# Patient Record
Sex: Female | Born: 1955 | Race: Black or African American | Hispanic: No | Marital: Single
Health system: Southern US, Academic
[De-identification: ages and names within clinical notes are randomized; demographics above are authoritative.]

## PROBLEM LIST (undated history)

## (undated) ENCOUNTER — Encounter: Payer: PRIVATE HEALTH INSURANCE | Attending: Family | Primary: Family

## (undated) ENCOUNTER — Encounter

## (undated) ENCOUNTER — Encounter: Attending: Family | Primary: Family

## (undated) ENCOUNTER — Encounter: Attending: Medical Oncology | Primary: Medical Oncology

## (undated) ENCOUNTER — Telehealth

## (undated) ENCOUNTER — Ambulatory Visit: Payer: PRIVATE HEALTH INSURANCE

## (undated) ENCOUNTER — Non-Acute Institutional Stay: Payer: PRIVATE HEALTH INSURANCE

## (undated) ENCOUNTER — Ambulatory Visit

## (undated) ENCOUNTER — Encounter
Attending: Student in an Organized Health Care Education/Training Program | Primary: Student in an Organized Health Care Education/Training Program

## (undated) ENCOUNTER — Telehealth: Attending: Medical Oncology | Primary: Medical Oncology

## (undated) ENCOUNTER — Encounter: Payer: PRIVATE HEALTH INSURANCE | Attending: Medical Oncology | Primary: Medical Oncology

## (undated) ENCOUNTER — Telehealth: Attending: Family | Primary: Family

## (undated) ENCOUNTER — Encounter: Payer: PRIVATE HEALTH INSURANCE | Attending: Women's Health | Primary: Women's Health

## (undated) ENCOUNTER — Encounter: Payer: PRIVATE HEALTH INSURANCE | Attending: Internal Medicine | Primary: Internal Medicine

## (undated) ENCOUNTER — Encounter: Payer: PRIVATE HEALTH INSURANCE | Attending: Registered" | Primary: Registered"

## (undated) ENCOUNTER — Encounter: Attending: Pharmacist | Primary: Pharmacist

## (undated) ENCOUNTER — Telehealth: Attending: Internal Medicine | Primary: Internal Medicine

---
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
## 2010-08-29 ENCOUNTER — Encounter
Admission: RE | Admit: 2010-08-29 | Discharge: 2010-08-29 | Payer: Self-pay | Source: Home / Self Care | Attending: Emergency Medicine | Admitting: Emergency Medicine

## 2011-12-30 IMAGING — MG MM DIGITAL SCREENING BILAT W/ CAD
4 series · 4 of 4 positions shown · non-contrast
Comparison: none

DG SCREEN MAMMOGRAM BILATERAL
Bilateral CC and MLO view(s) were taken.

DIGITAL SCREENING MAMMOGRAM WITH CAD:
The breast tissue is heterogeneously dense.  No masses or malignant type calcifications are 
identified.  Compared with prior studies.
Images were processed with CAD.

[R CC]
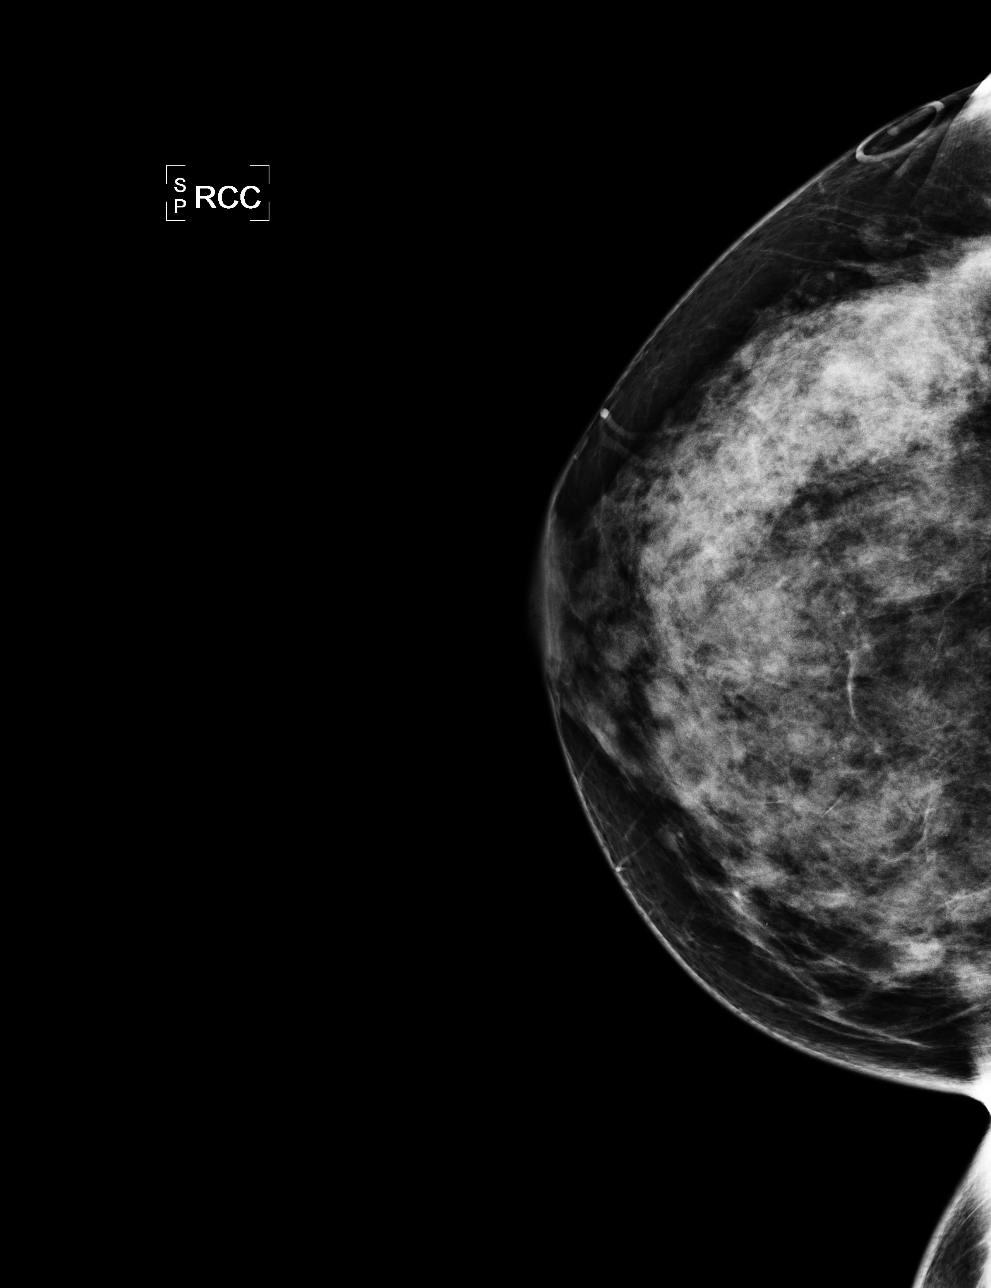

[L CC]
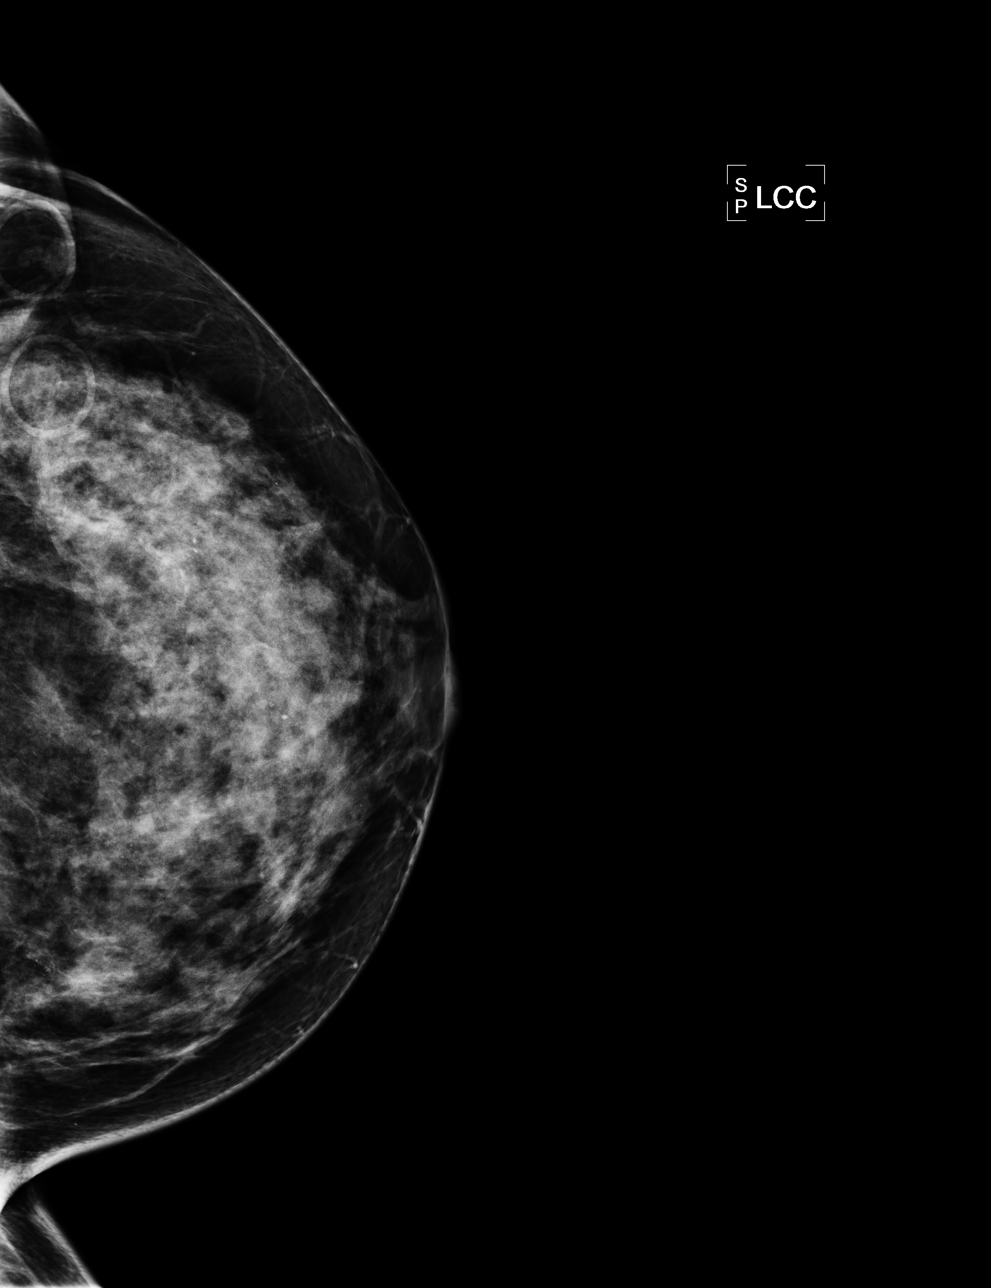

[L MLO]
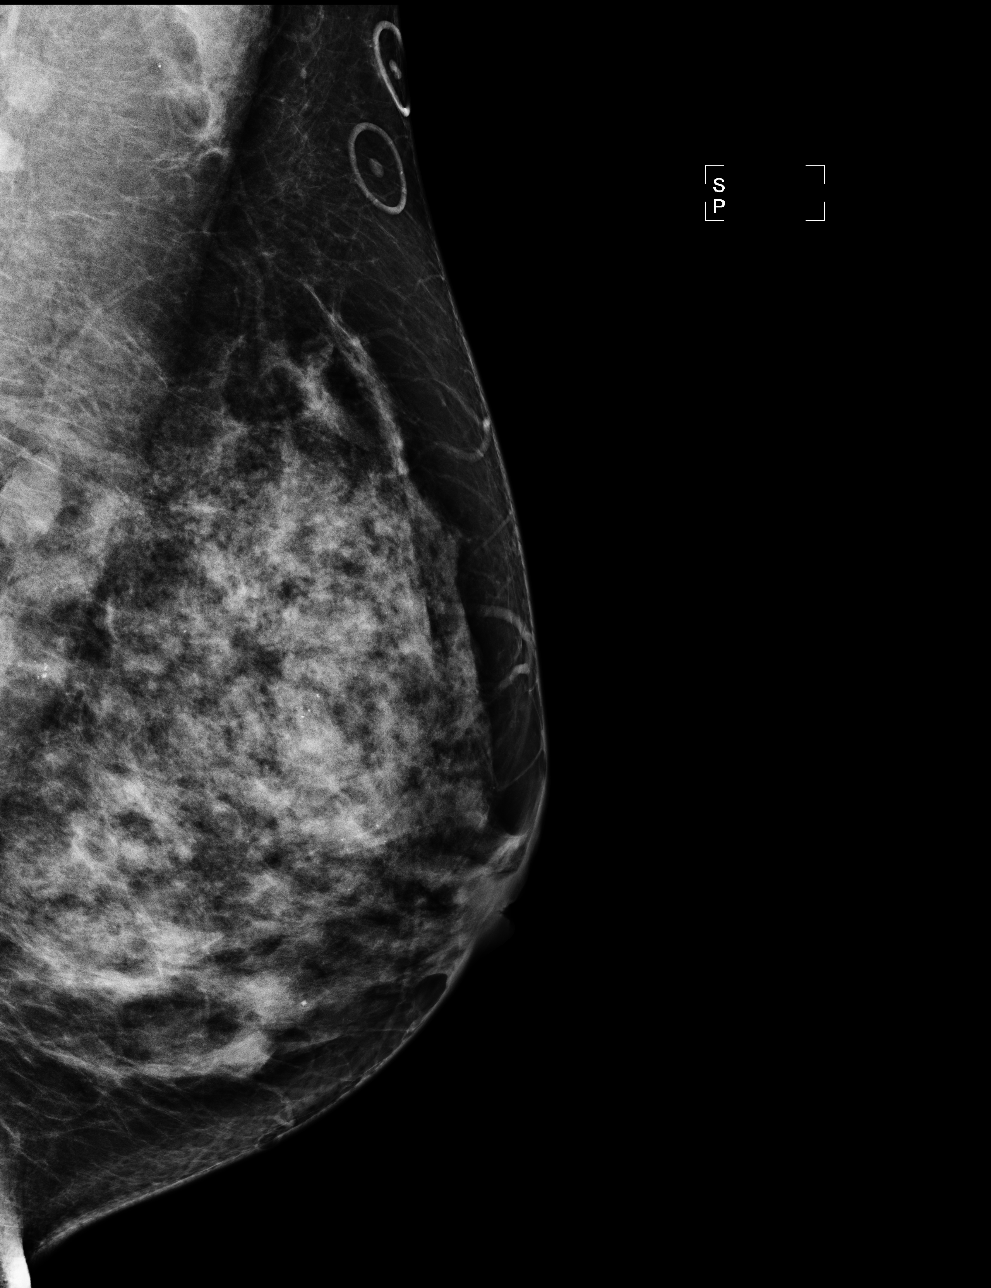

[R MLO]
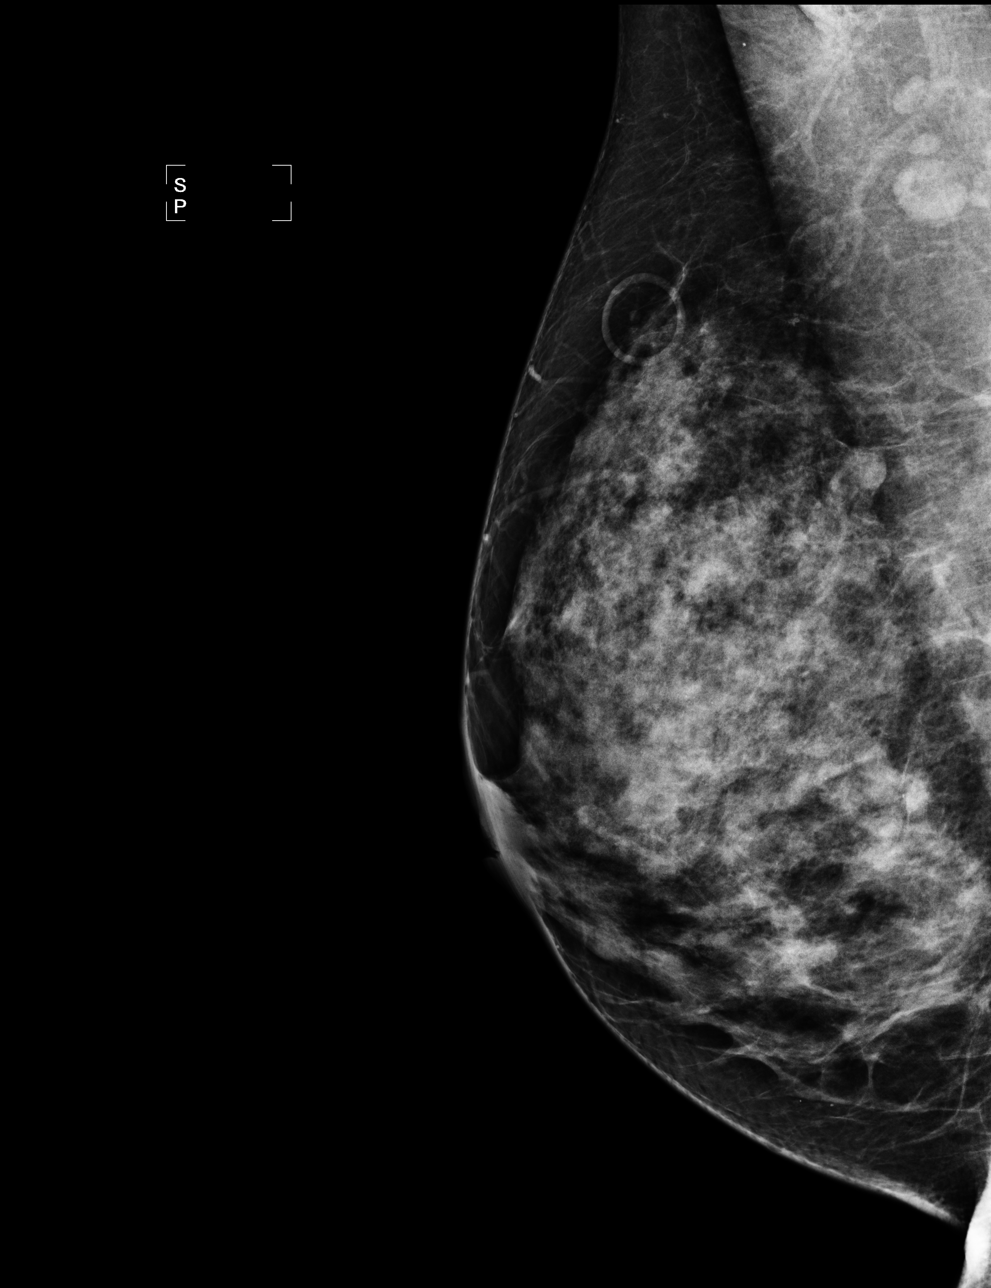

[4 of 4 positions shown; findings below may reference images not displayed]

IMPRESSION: No specific mammographic evidence of malignancy.  Next screening mammogram is recommended in one 
year.

A result letter of this screening mammogram will be mailed directly to the patient.

ASSESSMENT: Negative - BI-RADS 1

Screening mammogram in 1 year.
,

## 2016-05-05 ENCOUNTER — Encounter: Payer: Self-pay | Admitting: Emergency Medicine

## 2016-05-05 ENCOUNTER — Ambulatory Visit
Admission: EM | Admit: 2016-05-05 | Discharge: 2016-05-05 | Disposition: A | Discharge status: Home / Self Care | Payer: BLUE CROSS/BLUE SHIELD | Attending: Family Medicine | Admitting: Family Medicine

## 2016-05-05 ENCOUNTER — Ambulatory Visit (INDEPENDENT_AMBULATORY_CARE_PROVIDER_SITE_OTHER): Payer: BLUE CROSS/BLUE SHIELD

## 2016-05-05 DIAGNOSIS — S92354A Nondisplaced fracture of fifth metatarsal bone, right foot, initial encounter for closed fracture: Secondary | ICD-10-CM

## 2016-05-05 DIAGNOSIS — M79671 Pain in right foot: Secondary | ICD-10-CM | POA: Diagnosis not present

## 2016-05-05 MED ORDER — MELOXICAM 7.5 MG PO TABS: 7.5000 mg | ORAL_TABLET | Freq: Every day | ORAL | 0 refills | Status: AC

## 2016-05-05 NOTE — Discharge Instructions (Signed)
Take medication as prescribed. Rest. Ice and elevate.   Follow up with podiatry this week as discussed.  Follow up with your primary care physician this week as needed. Return to Urgent care for new or worsening concerns.

## 2016-05-05 NOTE — ED Provider Notes (Signed)
[facility] ____________________________________________  Time seen: Approximately 12:04 PM  I have reviewed the triage vital signs and the nursing notes.   HISTORY  Chief Complaint Foot Pain   HPI Mary Hancock is a 60 y.o. female presents for the complaint of right foot pain. Patient reports last Thursday she got up quickly to go to the restroom and states in that process she accidentally rolled right foot. Patient reports right foot pain since. Denies any other pain or injury. Denies head injury or loss of consciousness.  Patient reports pain at this time is mild. Patient reports yesterday the pain was more so present, and she had a call at work as she has to stand all day. Patient reports pain is primarily to right lateral foot but occasionally feels it on the bottom of her foot. Denies history of foot problems. Denies pain prior to injury. Denies pain radiation. Denies numbness or tingling sensation. Denies any other complaints. Patient reports over-the-counter Tylenol and Advil has helped.  No LMP recorded. Patient is postmenopausal.  ,  History reviewed. No pertinent past medical history.  There are no active problems to display for this patient.   History reviewed. No pertinent surgical history.    No current facility-administered medications for this encounter.   Current Outpatient Prescriptions:  .  meloxicam (MOBIC) 7.5 MG tablet, Take 1 tablet (7.5 mg total) by mouth daily., Disp: 10 tablet, Rfl: 0  Allergies Review of patient's allergies indicates no known allergies.   family history. Mother high blood pressure  Social History Social History  Substance Use Topics  . Smoking status: Never Smoker  . Smokeless tobacco: Never Used  . Alcohol use No    Review of Systems Constitutional: No fever/chills Eyes: No visual changes. ENT: No sore throat. Cardiovascular: Denies chest pain. Respiratory: Denies shortness of breath. Gastrointestinal:  No abdominal pain.  No nausea, no vomiting.  No diarrhea.  No constipation. Genitourinary: Negative for dysuria. Musculoskeletal: Negative for back pain.As above. Skin: Negative for rash. Neurological: Negative for headaches, focal weakness or numbness.  10-point ROS otherwise negative.  ____________________________________________   PHYSICAL EXAM:  VITAL SIGNS: ED Triage Vitals  Enc Vitals Group     BP 05/05/16 1152 (!) 141/79     Pulse Rate 05/05/16 1152 66     Resp 05/05/16 1152 16     Temp 05/05/16 1152 97.8 F (36.6 C)     Temp Source 05/05/16 1152 Tympanic     SpO2 05/05/16 1152 100 %     Weight 05/05/16 1153 150 lb (68 kg)     Height 05/05/16 1153 5\' 1"  (1.549 m)     Head Circumference --      Peak Flow --      Pain Score 05/05/16 1155 1     Pain Loc --      Pain Edu? --      Excl. in GC? --     Constitutional: Alert and oriented. Well appearing and in no acute distress. Eyes: Conjunctivae are normal. PERRL. EOMI. ENT      Head: Normocephalic and atraumatic. Cardiovascular: Normal rate, regular rhythm. Grossly normal heart sounds.  Good peripheral circulation. Respiratory: Normal respiratory effort without tachypnea nor retractions. Breath sounds are clear and equal bilaterally. No wheezes/rales/rhonchi.. Musculoskeletal:  Changes positions quickly with movement in all extremities.  Bilateral pedal pulses equal and easily palpated. Except: Right lateral mid to proximal foot mild tenderness to palpation, minimal swelling, no ecchymosis, no erythema, skin intact, right foot full  range of motion but mild pain with ankle rotation. No pain with plantar flexion or dorsiflexion of right foot. Right foot with normal sensation and normal distal capillary refill. Neurologic:  Normal speech and language. No gross focal neurologic deficits are appreciated. Speech is normal. No gait instability.  Skin:  Skin is warm, dry and intact. No rash noted. Psychiatric: Mood and affect are  normal. Speech and behavior are normal. Patient exhibits appropriate insight and judgment   ___________________________________________   LABS (all labs ordered are listed, but only abnormal results are displayed)  Labs Reviewed - No data to display ____________________________________________  RADIOLOGY  Dg Foot Complete Right  Result Date: 05/05/2016 CLINICAL DATA:  Rolled right foot 5 days ago, fourth and fifth metatarsal pain EXAM: RIGHT FOOT COMPLETE - 3+ VIEW COMPARISON:  None. FINDINGS: Three views of the right foot submitted. There is nondisplaced transverse fracture at the base of fifth metatarsal. No radiopaque foreign body. IMPRESSION: Nondisplaced fracture at the base of fifth metatarsal. Electronically Signed   By: Natasha MeadLiviu  Pop M.D.   On: 05/05/2016 12:28   ____________________________________________   PROCEDURES Procedures   Right foot boot applied by RN.   INITIAL IMPRESSION / ASSESSMENT AND PLAN / ED COURSE  Pertinent labs & imaging results that were available during my care of the patient were reviewed by me and considered in my medical decision making (see chart for details).  Well-appearing patient. No acute distress. Presents for the complaints of right lateral foot pain post mechanical injury this past Thursday. Denies other pain or injury. Patient reports pain present immediately after rolling her foot. Will evaluate x-ray.  X-ray reviewed. Per radiologist's nondisplaced fracture the base of the fifth metatarsal. However in reviewing the x-ray myself, concern for possible lateral calcaneal avulsion fracture as well. Discussed this in detail with patient, and reviewed xray. Discussed options of treatment and follow-up. Patient reports she has been ambulating on right foot since the injury. As this is multiple days old and patient has remained ambulatory, will place patient in a boot. Patient states that she does not want to be on crutches, but states has crutches at  home and will use. Encouraged ice, elevation and limited weightbearing. Follow-up with podiatry in 1-2 days. Information for podiatry given. Mobic daily as needed for pain.Discussed indication, risks and benefits of medications with patient.  Discussed follow up with Primary care physician this week. Discussed follow up and return parameters including no resolution or any worsening concerns. Patient verbalized understanding and agreed to plan.   ____________________________________________   FINAL CLINICAL IMPRESSION(S) / ED DIAGNOSES  Final diagnoses:  Nondisplaced fracture of fifth metatarsal bone, right foot, initial encounter for closed fracture  Right foot pain     Discharge Medication List as of 05/05/2016 12:57 PM    START taking these medications   Details  meloxicam (MOBIC) 7.5 MG tablet Take 1 tablet (7.5 mg total) by mouth daily., Starting Tue 05/05/2016, Normal        Note: This dictation was prepared with Dragon dictation along with smaller phrase technology. Any transcriptional errors that result from this process are unintentional.    Clinical Course      Renford DillsLindsey Marra Fraga, NP 05/05/16 1330

## 2016-05-05 NOTE — ED Triage Notes (Signed)
Patient states that she rolled her right foot last Thursday and is having pain in her right foot.

## 2017-06-21 ENCOUNTER — Ambulatory Visit: Admission: RE | Admit: 2017-06-21 | Discharge: 2017-07-21 | Disposition: A | Discharge status: Home / Self Care

## 2017-06-21 ENCOUNTER — Ambulatory Visit
Admission: RE | Admit: 2017-06-21 | Discharge: 2017-07-21 | Disposition: A | Discharge status: Home / Self Care | Payer: BC Managed Care – PPO

## 2017-06-21 DIAGNOSIS — C50811 Malignant neoplasm of overlapping sites of right female breast: Principal | ICD-10-CM

## 2017-07-12 ENCOUNTER — Ambulatory Visit
Admit: 2017-07-12 | Discharge: 2017-07-12 | Disposition: A | Discharge status: Home / Self Care | Payer: BC Managed Care – PPO

## 2017-07-12 ENCOUNTER — Ambulatory Visit
Admission: RE | Admit: 2017-07-12 | Discharge: 2017-07-12 | Payer: BC Managed Care – PPO | Attending: Surgical Oncology | Admitting: Surgical Oncology

## 2017-07-12 DIAGNOSIS — C50911 Malignant neoplasm of unspecified site of right female breast: Principal | ICD-10-CM

## 2017-07-15 ENCOUNTER — Ambulatory Visit
Admission: RE | Admit: 2017-07-15 | Discharge: 2017-07-15 | Disposition: A | Discharge status: Home / Self Care | Payer: BC Managed Care – PPO

## 2017-07-15 ENCOUNTER — Ambulatory Visit
Admission: RE | Admit: 2017-07-15 | Discharge: 2017-07-15 | Disposition: A | Discharge status: Home / Self Care | Payer: BC Managed Care – PPO | Attending: Surgical Oncology | Admitting: Surgical Oncology

## 2017-07-15 DIAGNOSIS — C50811 Malignant neoplasm of overlapping sites of right female breast: Secondary | ICD-10-CM

## 2017-07-15 DIAGNOSIS — C50911 Malignant neoplasm of unspecified site of right female breast: Principal | ICD-10-CM

## 2017-07-15 DIAGNOSIS — Z170 Estrogen receptor positive status [ER+]: Secondary | ICD-10-CM

## 2017-07-16 ENCOUNTER — Ambulatory Visit
Admission: RE | Admit: 2017-07-16 | Discharge: 2017-07-16 | Disposition: A | Discharge status: Home / Self Care | Payer: BC Managed Care – PPO | Attending: Medical Oncology | Admitting: Medical Oncology

## 2017-07-16 DIAGNOSIS — C50911 Malignant neoplasm of unspecified site of right female breast: Principal | ICD-10-CM

## 2017-07-16 NOTE — Unmapped (Signed)
MEDICAL ONCOLOGY NEW PATIENT VISIT    Patient Name: Brenda Mccarty  Patient Age: 61 y.o.  Encounter Date: 07/16/2017  Referring Physician:   Referred Self  No address on file  Primary Care Provider:  No PCP Per Patient    Cancer Team  Surgical Oncology: Lucretia Roers, MD  Radiation Oncology: Thom Chimes, MD  Medical Oncology: Marc Morgans, MD  Plastic Surgery: None at this time  Genetics:     History of Present Illness:   Brenda Mccarty is a 61 y.o. female who is seen in consultation at the request of Dr Tama Gander for an evaluation of Breast Cancer.    See details below.  Young woman with strong family hx and unknown mutational status.  Presents with 2-3 month h/o breast swelling. Has been seen at [facility] but [name] is now out of network for her. Has a large fixed right breast mass and + axillary nodes clinically palpable.  Bx  At [facility] has shown  IDCA grade 3 ER 94% PR <1% abd HER 2 1+    ROS:  Wtloss, LBP and hip pain x few weeks.  Decreased appetite, mild fatigue  LMP 2009        Review of Systems: A complete review of systems was obtained including: Constitutional, Eyes, ENT, Cardiovascular, Respiratory, GI, GU, Musculoskeletal, Skin, Neurological, Psychiatric, Endocrine, Heme/Lymphatic, and Allergic/Immunologic systems. It is negative or non-contributory to the patient???s management except for the above  Medical History:  Oncology History    61 postmenopausal yF         Malignant neoplasm of overlapping sites of right female breast (CMS-HCC)    05/2017 -  Presenting Symptoms     End of Sept pt noticed b/l breast swelling, 1 week later left breast swelling resolved but right breast swelling worsened.   Last that week noticed non-erythematous-painful skin dimpling to the lateral right breast. She also palpated an intermittent mass in the right breast with baseline inverted nipples that seemed more pulled in on the right. Two weeks later presented to Urgent Care, prompting MMG/US.            06/24/2017 Interval Scan(s) B/L Dx MMG: increased right breast density and ill-defined mass @9 :00 with associated posterior architectural distortion. Benign calcs B/L.   Left breast clear  [place]: Right breast 2.5 cm irregular mass at 9:00 middle depth.          06/29/2017 Biopsy     Right breast Bx: 9:00 7CFN, IDC-G2 with DCIS-G3, LVI  Right axillary LN Bx: IDC, G3         06/30/2017 -  Other     New pt consult-[facility], MD.   PE showed Rt enlarged-retracted-elevated breast, large irregularly shaped mobile mass along entire lateral aspect of breast from 7-1:00, nipple inversion Rt>Lt, skin dimpling inferior nipple, +Rt axillary LN.  RECS: B/L MRI breast and genetics referral.           Other             Past Medical History:   Diagnosis Date   ??? Hypertension       Past Surgical History:   Procedure Laterality Date   ??? MYOMECTOMY        Family History   Problem Relation Age of Onset   ??? Heart attack Mother    ??? Pancreatic cancer Father    ??? Breast cancer Sister         Deceased of BCA at 57y/o   ??? Sarcoidosis  Brother    ??? Breast cancer Sister         Alive, Dx BCA at 29 y/o-remission doing well   ??? Kidney cancer Sister    ??? Breast cancer Other         Niece Dx 42 y/o-remission doing well        SOCIAL  HX:  Here today with 2 family members.  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 No tobacco or ETOH.  Married . No children  No Known Allergies    Current Outpatient Prescriptions:   ???  acetaminophen (TYLENOL) 500 MG tablet, Take 500 mg by mouth., Disp: , Rfl:   ???  ibuprofen (ADVIL,MOTRIN) 200 MG tablet, Take 200 mg by mouth., Disp: , Rfl:   ???  lisinopril (PRINIVIL,ZESTRIL) 10 MG tablet, Take by mouth., Disp: , Rfl:   ???  meloxicam (MOBIC) 7.5 MG tablet, Take 7.5 mg by mouth., Disp: , Rfl:     Objective History:  BP 119/61  - Pulse 78  - Temp 36.6 ??C (97.9 ??F) (Oral)  - Resp 16  - Ht 158.1 cm (5' 2.24)  - Wt 66.4 kg (146 lb 6.4 oz)  - SpO2 99%  - BMI 26.57 kg/m??   BMI: Body mass index is 26.57 kg/m??.  GENERAL: Well appearing female in no acute distress.  SKIN: no rashes or skin changes noted  LUNGS: CTA b/l  CARDIAC: RRR , no murmur  CHEST/BREASTS: large fixed right breast mass at least 10x 11. + palpable right ax node  LYMPH: no OTHER cervical, axillary or supraclavicular lymphadenopathy  ABDOMEN: Soft, nontender, no hepatomegaly  EXTREMITIES: no edema, normal skin tone  MSK: No focal areas of bone tenderness  NEURO: Alert, oriented, gait/coordination normal    Orders/Results:  I have reviewed the laboratory, pathology, and radiology reports in detail and discussed findings with patient and spouse    Assessment/Plan:  1. Invasive ductal carcinoma of breast, female, right (CMS-HCC)  ER+ PR- HER 2 =1+    After reviewing all available records and imaging, we had a frank and thorough discussion regarding her course of care. The risks and benefits of this treatment plan were discussed in detail.  ??  Recommendations and Plan:I spent at least 60 minutes with this patient more than 50% in counseling. The following issues were discussed:      Today, I have talked with the patient about the   nature and diagnosis of early stage breast cancer.  We talked about  the role of chemotherapy in reducing the risk of systemic recurrence.  We have talked about the  Receptors on her cancer. We have talked about a regimen including cytotoxic  chemotherapy, antiestrogen therapy, surgery, radiation,  and then we have talked about the ordering of therapy.  One option  would be a primary surgical approach and then adjuvant therapy  .  Another option would be the neoadjuvant  approach; if we used that approach, I would recommend AC dose-dense  x4 followed by 12 weeks' Taxol  followed by surgery,  followed by radiation  and then endocrine  therapy to follow for 5 years at least.   We reviewed the data on neoadjuvant therapy. There is no survival difference related to order of therapy in the large randomized trials. There is a higher rate of breast conserving therapy in the neoadjuvatn groups. One advantage of the neoadjuvant  approach is that it allow [place] to get a personalized assessment of  whether her  disease responds to therapy.  Furthermore , we and other groups have shown that  post neoadjuvant chemo staging predicts for  outcome .  So, we have discussed  the pros and cons of each approach and she has elected to go forward  with neoadjuvant therapy.    We have then turned our attention to the logistics of the regimen.  We have talked   about Adriamycin and Cytoxan x4.  We have talked about hair loss,  myelosuppression, nausea and vomiting, rare risk of cardiac toxicity.      We next turned our attention to required elements before we could  get started.  We are going to do staging CT.  We are going to do  a MUGA or echo.  She will need a Port-A-Cath.    She has  my card and e-mail and knows to call with any problems or questions.  All of her questions were answered today to the best of my ability  to her apparent satisfaction.      Plan:  Staging   ECHO  Port  ddAC-T if staging negative  Needs genetics work up if not already done at [facility]

## 2017-07-16 NOTE — Unmapped (Signed)
It was a pleasure seeing you today.   Continue with the current plan of care.  Follow-up in 1 week.        Labs from today if done:       Future Appointments:    For appointments call [phone]  For new symptoms or health related issues, please call   Nurse Navigator: Dan Maker, RN [phone]  On weekends and after hours on weekdays, please call [phone] for the oncology fellow on call.      If you use my[facility]chart, please know that I check messages only twice a week. If your message is urgent, please call or page your nurse navigator or the on-call fellow.

## 2017-07-21 ENCOUNTER — Ambulatory Visit
Admission: RE | Admit: 2017-07-21 | Discharge: 2017-07-21 | Disposition: A | Discharge status: Home / Self Care | Payer: BC Managed Care – PPO

## 2017-07-21 DIAGNOSIS — C50911 Malignant neoplasm of unspecified site of right female breast: Principal | ICD-10-CM

## 2017-07-22 ENCOUNTER — Ambulatory Visit
Admission: RE | Admit: 2017-07-22 | Discharge: 2017-07-22 | Disposition: A | Discharge status: Home / Self Care | Payer: BC Managed Care – PPO

## 2017-07-22 DIAGNOSIS — C50911 Malignant neoplasm of unspecified site of right female breast: Principal | ICD-10-CM

## 2017-07-23 ENCOUNTER — Ambulatory Visit: Admission: RE | Admit: 2017-07-23 | Discharge: 2017-07-23 | Disposition: A | Discharge status: Home / Self Care

## 2017-07-23 ENCOUNTER — Ambulatory Visit
Admission: RE | Admit: 2017-07-23 | Discharge: 2017-07-23 | Disposition: A | Discharge status: Home / Self Care | Payer: BC Managed Care – PPO | Attending: Medical Oncology | Admitting: Medical Oncology

## 2017-07-23 DIAGNOSIS — C50919 Malignant neoplasm of unspecified site of unspecified female breast: Principal | ICD-10-CM

## 2017-07-23 DIAGNOSIS — C50911 Malignant neoplasm of unspecified site of right female breast: Principal | ICD-10-CM

## 2017-07-23 LAB — COMPREHENSIVE METABOLIC PANEL
ALBUMIN: 3.9 g/dL (ref 3.5–5.0)
ALKALINE PHOSPHATASE: 80 U/L (ref 38–126)
ALT (SGPT): 33 U/L (ref 15–48)
ANION GAP: 8 mmol/L — ABNORMAL LOW (ref 9–15)
AST (SGOT): 40 U/L — ABNORMAL HIGH (ref 14–38)
BILIRUBIN TOTAL: 0.5 mg/dL (ref 0.0–1.2)
BLOOD UREA NITROGEN: 20 mg/dL (ref 7–21)
CALCIUM: 10.2 mg/dL (ref 8.5–10.2)
CHLORIDE: 100 mmol/L (ref 98–107)
CO2: 30 mmol/L (ref 22.0–30.0)
CREATININE: 0.74 mg/dL (ref 0.60–1.00)
EGFR MDRD AF AMER: >=60 mL/min/{1.73_m2} (ref >=60)
EGFR MDRD NON AF AMER: >=60 mL/min/{1.73_m2} (ref >=60)
POTASSIUM: 4.5 mmol/L (ref 3.5–5.0)
PROTEIN TOTAL: 7.5 g/dL (ref 6.5–8.3)
SODIUM: 138 mmol/L (ref 135–145)

## 2017-07-23 LAB — CBC W/ AUTO DIFF
BASOPHILS ABSOLUTE COUNT: 0 10*9/L (ref 0.0–0.1)
EOSINOPHILS ABSOLUTE COUNT: 0.1 10*9/L (ref 0.0–0.4)
HEMATOCRIT: 32.5 % — ABNORMAL LOW (ref 36.0–46.0)
HEMOGLOBIN: 10.2 g/dL — ABNORMAL LOW (ref 12.0–16.0)
LARGE UNSTAINED CELLS: 3 % (ref 0–4)
LYMPHOCYTES ABSOLUTE COUNT: 1.5 10*9/L (ref 1.5–5.0)
MEAN CORPUSCULAR HEMOGLOBIN CONC: 31.4 g/dL (ref 31.0–37.0)
MEAN CORPUSCULAR HEMOGLOBIN: 29.2 pg (ref 26.0–34.0)
MEAN PLATELET VOLUME: 10.1 fL — ABNORMAL HIGH (ref 7.0–10.0)
MONOCYTES ABSOLUTE COUNT: 0.2 10*9/L (ref 0.2–0.8)
NEUTROPHILS ABSOLUTE COUNT: 2.5 10*9/L (ref 2.0–7.5)
PLATELET COUNT: 286 10*9/L (ref 150–440)
RED BLOOD CELL COUNT: 3.49 10*12/L — ABNORMAL LOW (ref 4.00–5.20)
WBC ADJUSTED: 4.4 10*9/L — ABNORMAL LOW (ref 4.5–11.0)

## 2017-07-23 LAB — PLATELET COUNT: Lab: 286

## 2017-07-23 LAB — CALCIUM: Calcium:MCnc:Pt:Ser/Plas:Qn:: 10.2

## 2017-07-23 MED ORDER — PALBOCICLIB 125 MG CAPSULE: 125 mg | capsule | Freq: Every day | 0 refills | 0 days | Status: AC

## 2017-07-23 MED ORDER — TRAMADOL 50 MG TABLET
ORAL_TABLET | Freq: Four times a day (QID) | ORAL | 0 refills | 0.00000 days | Status: CP | PRN
Start: 2017-07-23 — End: 2017-11-19

## 2017-07-23 MED ORDER — LETROZOLE 2.5 MG TABLET
ORAL_TABLET | Freq: Every day | ORAL | 11 refills | 0 days | Status: CP
Start: 2017-07-23 — End: 2017-11-19

## 2017-07-23 MED ORDER — PALBOCICLIB 125 MG CAPSULE
ORAL_CAPSULE | Freq: Every day | ORAL | 0 refills | 0.00000 days | Status: CP
Start: 2017-07-23 — End: 2017-07-23

## 2017-07-23 NOTE — Unmapped (Signed)
If you feel like this is an emergency please call 911.  For appointments or questions Monday through Friday 8AM-5PM please call [phone] or Toll Free [phone]. For Medical questions or concerns ask for the Nurse Triage Line.  On Nights, Weekends, and Holidays call [phone] and ask for the Oncologist on Call.  Reasons to call the Nurse Triage Line:  Fever of 100.5 or greater  Nausea and/or vomiting not relieved with nausea medicine  Diarrhea or constipation  Severe pain not relieved with usual pain regimen  Shortness of breath  Uncontrolled bleeding  Mental status changes

## 2017-07-23 NOTE — Unmapped (Signed)
Pt received to chair 55, Pt has a port already in place with a good blood return. This is the patient's first time receiving zometa. Pharmacist was paged.  1345 pt tolerated treatment. Port was flushed and deaccessed. Pt given a copy of AVS.  Pt was discharged from clinic.

## 2017-07-23 NOTE — Unmapped (Signed)
Pharmacy: First Dose Patient Education    Medication: Zometa  Venous Access: port    Ms. Orosz is a 61 year old with metastatic breast cancer. Education was provided to the patient by an oncology pharmacist.      Side effects discussed included but were not limited to:   infusion-related reactions, nausea/vomiting, flu-like symptoms, myalgia or fatigue.    The [facility] patient handout or the Hematology/Oncology Fellow on-call phone number for concerns after hours were given.The patient verbalized understanding of this information.  Medication reconciliation was completed and the medication list was updated in EPIC.      Approximate time spent with patient: 10  Minutes.    Kennon Holter, PharmD, CPP

## 2017-07-23 NOTE — Unmapped (Signed)
Labs within parameters for treatment. Drug requested from pharmacy.

## 2017-07-27 ENCOUNTER — Ambulatory Visit
Admission: RE | Admit: 2017-07-27 | Discharge: 2017-07-27 | Disposition: A | Discharge status: Home / Self Care | Payer: BC Managed Care – PPO

## 2017-07-27 DIAGNOSIS — C50919 Malignant neoplasm of unspecified site of unspecified female breast: Principal | ICD-10-CM

## 2017-07-27 NOTE — Unmapped (Signed)
VIR POST PROCEDURE NOTE      Date/Time: 07/27/2017/12:35 PM      Attending: Dr. Lissa Hoard      Assistant(s): Dr. Waynard Edwards      Diagnosis: History of breast cancer with liver lesions      Procedure:  Biopsy      Time out: Prior to the procedure, a time out was performed with all team members present.  During the time out, the patient, procedure and procedure site when applicable were verbally verified by the team members and Dr. Lissa Hoard.      Anesthesia: Conscious Sedation     Medications Given:  3 mg Versed IV, 75 mcg Fentanyl IV     Contrast Used: None     Complications: NONE     Estimated Blood Loss: Minimal    Specimens:  3 18-gauge core biopsy samples sent for tissue diagnosis       Major Findings:  Multiple hypoechoic liver lesion. Successful ultrasound guided biopsy of a segment 4b lesion.       Plan: Follow up pathology results.       Attending Signature:  Lissa Hoard MD, PhD    See detailed procedure note with images in PACS.      The patient tolerated the procedure well without incident or complication and was returned to PRU.

## 2017-07-27 NOTE — Unmapped (Signed)
Assessment/Plan:    Brenda Mccarty is a 61 y.o. female who will undergo targeted liver biopsy in Interventional Radiology.    --This procedure has been fully reviewed with the patient/patient???s authorized representative. The risks, benefits and alternatives have been explained, and the patient/patient???s authorized representative has consented to the procedure.  --The patient will accept blood products in an emergent situation.  --The patient does not have a Do Not Resuscitate order in effect.    HPI: Brenda Mccarty is a 61 y.o. female with history of breast cancer and new liver lesions who is scheduled to undergo target liver biopsy.     Allergies: No Known Allergies    Medications:  No relevant medications, please see full medication list in Epic.    ASA Grade: ASA 2 - Patient with mild systemic disease with no functional limitations    PSH:   Past Surgical History:   Procedure Laterality Date   ??? IR INSERT PORT AGE GREATER THAN 5 YRS  07/22/2017    IR INSERT PORT AGE GREATER THAN 5 YRS 07/22/2017 Brenda Erie, MD IMG VIR [facility]   ??? MYOMECTOMY         PMH:   Past Medical History:   Diagnosis Date   ??? Hypertension        PE:    Vitals:    07/27/17 1017   BP: 103/58   Pulse: 60   Temp: 36.4 ??C (97.5 ??F)   SpO2: 100%     General: WD, WN female in NAD.   HEENT: Normocephalic, atraumatic.   Lungs: Respirations nonlabored  Mallampati Class:  Class II        Brenda James, MD  07/27/2017, 10:20 AM

## 2017-07-29 MED FILL — IBRANCE/125MG/CAPS: IBRANCE/125MG/CAPS | 28 days supply | Qty: 21 | Fill #0

## 2017-07-29 NOTE — Unmapped (Signed)
Clinical Pharmacist Practitioner    [employer] [place] Education    Brenda Mccarty is a 61 y.o. female with metastatic breast cancer who I am counseling today on initiation of oral chemotherapy.    Oral chemotherapy regimen: palbociclib 125 mg daily and letrozole  Tentative Start Date: 07/30/17    Side effects discussed included but were not limited to: fatigue, neutropenia, thrombocytopenia, nausea, diarrhea, decreased appetite.   Side effect prevention and management were also reviewed. Blood count monitoring will be needed every 2 weeks for the first 2 months then monthly prior to each cycle there after. Proper administration of the medication including the importance of taking with food, oral chemotherapy handling precautions, as well as the importance of medication adherence were explained.    Instructed the patient that this medication has potential drug interactions.  The patient should inform me of any new prescriptions so that I may evaluate for potential drug interactions.    Patient verbalized understanding of the above information as well as how to contact the Team with any questions/concerns.    Approximate time with patient: 10 min    Laverna Peace PharmD, BCOP, CPP  Hematology/Oncology Pharmacist  P: [phone]    Next Scheduled Delivery Date: 07/30/17 from [facility]   Confirmed Shipping address: [address] [place] [address]    Financial/Shipment   Primary Billing: BCBS  Secondary Billing [place] Bucker, [place], CoPay Card, Secondary Commercial): copay card   Anticipated copay of $0 reviewed with patient (See full details under Referrals tab in EPIC).    Verified delivery address in FSI and reviewed medication storage requirement.    The following was explained to the patient:  Advised patient of the following:  -A Welcome packet will be sent to the patient   -Assignment of Benefit for the patient to review and return before next refill  -Arrangement of payment method can be done by contacting the pharmacy  -Take medications with during travel, have doctor's appointments, or if being admitted to the hospital.    Advised patient of refill order process:  Specialty pharmacy process for medication shipment was also reviewed.  Discussed the service provided by [facility] with respects to monthly outbound calls to the patient to set up refill deliveries 7-10 days prior to their subsequent needed refill.  Emphasized need for patient to be reachable in order to schedule medication shipment and that shipment will be shipped to the deliverable address provided via UPS.  Informed patient that welcome packet will be sent.        Provided the [facility] pharmacy contact information below:  [facility] [phone], option 4)    Patient Specific   The following items were reviewed and no issues were identified:  Complete medication list, PMH, allergies    Relevant cultural assessment and health literacy was completed as below:  Patient speaks  English, has no other identified physical or cognitive barriers and is able to store his/her medication as directed.  Patient prefers to have medications discussed with  Patient.  Patient is able to read and understand education materials at a high school level or above.

## 2017-08-02 NOTE — Unmapped (Signed)
STRATA ordered per Dr. Leana Roe.   ??  Histology will cut slides from 07/27/2017 biopsy for [facility]. Once report is generated it will be put in pts Labs tab in their EMR.

## 2017-08-02 NOTE — Unmapped (Signed)
Interval Visit Note    Patient Name: Brenda Mccarty  Patient Age: 61 y.o.  Encounter Date: 07/23/2017    PCP:No PCP Per Patient  REFERRING MD:  Dr Lucretia Roers        HPI/ INTERVAL:  The patient is a 61 year old woman whom I first met 1 week ago. At that time she had presented with a large clinical T3 pathologic N1 right breast cancer which was ER positive PR negative HER-2 negative. Please see last weeks notes for full details and the oncology history below.    She returns today to discuss her staging studies which unfortunately show metastatic disease. The bone scan is diffusely positive and she has apparent pulmonary and liver metastases.    She is unaccompanied today however after we've talked for a bit it becomes clear that her husband is in the waiting room side asked the nurses to bring him in the room so he could hear this news as well.    On review of symptoms she continues to have decreased appetite mild nausea and mild insomnia lower back pain and pain in her right breast.    ROS: She has filled out the [facility] [id number] form which I have reviewed with her and filed in med rec . Comprehensive 12 point ROS is performed and is negative except as noted above.           ONCOLOGY HX:    Oncology History    61 postmenopausal yF  With presenting stage 4 breast cancer 07/2017  ER+ PR- HER2-         Malignant neoplasm of overlapping sites of right female breast (CMS-HCC)    05/2017 -  Presenting Symptoms     End of Sept pt noticed b/l breast swelling, 1 week later left breast swelling resolved but right breast swelling worsened.   Last that week noticed non-erythematous-painful skin dimpling to the lateral right breast. She also palpated an intermittent mass in the right breast with baseline inverted nipples that seemed more pulled in on the right. Two weeks later presented to Urgent Care, prompting MMG/US.            06/24/2017 Interval Scan(s)     B/L Dx MMG: increased right breast density and ill-defined mass @9 :00 with associated posterior architectural distortion. Benign calcs B/L.   Left breast clear  [place]: Right breast 2.5 cm irregular mass at 9:00 middle depth.          06/29/2017 Biopsy     Right breast Bx: 9:00 7CFN, IDC-G2 with DCIS-G3, LVI  Right axillary LN Bx: IDC, G3         06/30/2017 -  Other     New pt consult-[facility] [place], MD.   PE showed Rt enlarged-retracted-elevated breast, large irregularly shaped mobile mass along entire lateral aspect of breast from 7-1:00, nipple inversion Rt>Lt, skin dimpling inferior nipple, +Rt axillary LN.  RECS: B/L MRI breast and genetics referral.          07/12/2017 -  Other     Hiram slide review:  A. Breast, right, site #1, 9:00, 7 cm from nipple, ultrasound-guided needle core biopsy  - Invasive ductal carcinoma of the breast (see diagnosis comment)  - Nottingham combined histologic grade: 2  - Tubule formation score: 3  - Nuclear pleomorphism score: 2  - Mitotic rate score: 1  - Largest contiguous size: 1.2 cm  - Lymphovascular invasion: Not identified  - Microcalcifications: Present in invasive carcinoma  - In situ  carcinoma: Present; ductal carcinoma in situ, solid and comedo types  - Per outside report, ancillary studies:  - Estrogen receptor: Positive (94%, 3+)  - Progesterone receptor: Negative (<1%, 1+)  - HER2 (by IHC): Negative (1+)  ??  B. 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 Biopsy planned            Review of Systems:    She has filled out the [facility] [id number] form which I have reviewed with her and filed in med rec . Comprehensive 12 point ROS is performed and is negative except as noted above.      Meds:  Current Outpatient Prescriptions:   ???  acetaminophen (TYLENOL) 500 MG tablet, Take 500 mg by mouth., Disp: , Rfl:   ???  lisinopril (PRINIVIL,ZESTRIL) 10 MG tablet, Take by mouth., Disp: , Rfl:   ???  letrozole (FEMARA) 2.5 mg tablet, Take 1 tablet (2.5 mg total) by mouth daily., Disp: 30 tablet, Rfl: 11  ???  palbociclib (IBRANCE) 125 mg capsule, Take 1 capsule (125 mg total) by mouth daily. for 21 days 21 days then 7 days off. Take with food., Disp: 30 capsule, Rfl: 0  ???  traMADol (ULTRAM) 50 mg tablet, Take 1 tablet (50 mg total) by mouth every six (6) hours as needed for pain., Disp: 30 tablet, Rfl: 0    ALLERGIES: No Known Allergies  PAST MEDICAL HX:  Past Medical History:   Diagnosis Date   ??? Hypertension          SOCIAL HX:  Here today with her husband . She has no children  She works in Engineering geologist    FAMHX:  2 sisters with breast cancer in their 46's   Genetics pending        Physical Exam:    Vital Signs for this encounter:  Vitals:    07/23/17 0759   BP: 111/55   Pulse: 60   Resp: 16   Temp: 36.5 ??C (97.7 ??F)   SpO2: 99%     BSA: 1.73 meters squared      GENERAL: Well-appearing woman in no acute distress.  HEENT:  NCAT, Full head of hair. Sclerae are anicteric.  Exam not repeated since last week.  Entirety of visit in counselling and coord care and lasts > 40 min     Performance Status:1      Orders/Results:        Lab on 07/23/2017   Component Date Value   ??? Sodium 07/23/2017 138    ??? Potassium 07/23/2017 4.5    ??? Chloride 07/23/2017 100    ??? CO2 07/23/2017 30.0    ??? BUN 07/23/2017 20    ??? Creatinine 07/23/2017 0.74    ??? BUN/Creatinine Ratio 07/23/2017 27    ??? EGFR MDRD Non Af Amer 07/23/2017 >=60    ??? EGFR MDRD Af Amer 07/23/2017 >=60    ??? Anion Gap 07/23/2017 8*   ??? Glucose 07/23/2017 102    ??? Calcium 07/23/2017 10.2    ??? Albumin 07/23/2017 3.9    ??? Total Protein 07/23/2017 7.5    ??? Total Bilirubin 07/23/2017 0.5    ??? AST 07/23/2017 40*   ??? ALT 07/23/2017 33    ???  Alkaline Phosphatase 07/23/2017 80    ??? WBC 07/23/2017 4.4*   ??? RBC 07/23/2017 3.49*   ??? HGB 07/23/2017 10.2*   ??? HCT 07/23/2017 32.5*   ??? MCV 07/23/2017 93.1    ??? [facility] 07/23/2017 29.2    ??? MCHC 07/23/2017 31.4    ??? RDW 07/23/2017 13.4    ??? MPV 07/23/2017 10.1*   ??? Platelet 07/23/2017 286    ??? Absolute Neutrophils 07/23/2017 2.5    ??? Absolute Lymphocytes 07/23/2017 1.5    ??? Absolute Monocytes 07/23/2017 0.2    ??? Absolute Eosinophils 07/23/2017 0.1    ??? Absolute Basophils 07/23/2017 0.0    ??? Large Unstained Cells 07/23/2017 3    ??? Hypochromasia 07/23/2017 Slight*       Assessment/Plan:        1. Metastatic breast cancer (CMS-HCC)  Today I have reviewed the imaging results with the patient and her husband. I have told him that we need to get a biopsy of the liver lesion to confirm the diagnosis but that the imaging is quite suspicious. Instead of doing Adriamycin and Cytoxan as planned today we will instead start Zometa. For first-line therapy of her metastatic disease I would plan letrozole and palbociclib. I have written prescriptions for those today. Because of her back pain we are going to obtain x-rays of the weightbearing areas to see if she would benefit from radiation.    In general,I have talked with the patient and her family about the nature and prognosis of metastatic breast cancer. We have talked about the goals of therapy. There is not a cure for this disease in 2018 . However the course of disease is quite variable.  We have talked about standard options for therapy.  I recommend letrozole/ palbociclib/  I have reviewed the logistics and potential side effects of these.      -biopsy liver  - XR Lumbar Spine 2 or 3 Views; Future  - XR Hip W Pelvis Right; Future  - pharmacy counselling re: ibrance  - follow up with labs p 2 weeks on ibrance.   - follow up with me in 4 weeks with next zometa  - genetics referral    All of their questions answered to the best of my ability and to their apparent satisfaction.

## 2017-08-04 ENCOUNTER — Ambulatory Visit: Admission: RE | Admit: 2017-08-04 | Discharge: 2017-08-04

## 2017-08-04 DIAGNOSIS — G8929 Other chronic pain: Secondary | ICD-10-CM

## 2017-08-04 DIAGNOSIS — C50811 Malignant neoplasm of overlapping sites of right female breast: Secondary | ICD-10-CM

## 2017-08-04 DIAGNOSIS — Z1159 Encounter for screening for other viral diseases: Secondary | ICD-10-CM

## 2017-08-04 DIAGNOSIS — C50919 Malignant neoplasm of unspecified site of unspecified female breast: Secondary | ICD-10-CM

## 2017-08-04 DIAGNOSIS — I10 Essential (primary) hypertension: Secondary | ICD-10-CM

## 2017-08-04 DIAGNOSIS — M545 Low back pain: Secondary | ICD-10-CM

## 2017-08-04 DIAGNOSIS — E663 Overweight: Secondary | ICD-10-CM

## 2017-08-04 DIAGNOSIS — Z1211 Encounter for screening for malignant neoplasm of colon: Secondary | ICD-10-CM

## 2017-08-04 DIAGNOSIS — Z170 Estrogen receptor positive status [ER+]: Secondary | ICD-10-CM

## 2017-08-04 DIAGNOSIS — Z0000 Encounter for general adult medical examination without abnormal findings: Principal | ICD-10-CM

## 2017-08-04 MED ORDER — LISINOPRIL 5 MG TABLET
ORAL_TABLET | Freq: Every day | ORAL | 3 refills | 0 days | Status: CP
Start: 2017-08-04 — End: 2017-09-02

## 2017-08-04 NOTE — Unmapped (Addendum)
One tylenol 500 mg every 8 hrs if needed.  Icy hot patches as needed for pain.  Warm baths.  Use Ultram if needed.    To obtain a reliable blood pressure reading at home, please follow these recommendations:    ?? Find a time when you are not rushed or anxious    ?? Avoid caffeine for 30 minutes before the test    ?? Sit in comfortable chair, leaning against chair back, with arm supported about 45 degrees away from you side (on chair arm, kitchen table, etc.)    ?? Keep legs uncrossed and feet flat on ground    ?? Wait 5 minutes before checking the pressure    ?? Ideally you can collect one reading, wait another three minutes, collect a second reading, then average the tops and bottoms to generate the recorded blood pressure          Thanks for choosing [facility]  for your medical care!    If you have any questions about your visit today, please call us at [phone].      ?? For medication refills, please have your pharmacist send an electronic refill request    ?? If you need care after 5:00 pm during the week or on the weekend:  ?? Call [facility] at [phone] for nurse/physician advice or...    ?? Go to [facility] walk-in clinic [address], [place] [phone] -- Open 7 days a week from 9:00AM - 8:00PM        ?? We will always try to notify you of the results from laboratory tests within ten days of the study.  If you do not hear from [place] by phone, letter, or electronic message, please call the office immediately for further information.         Before your next visit:  --   Please keep a list of any questions you may have so that we can discuss them together.    Goals     None

## 2017-08-05 NOTE — Unmapped (Addendum)
Blood pressure is at goal below 130/80  Medications: decrease lisinopril to 5 mg qd.  Labs: lipid levels  Log sheet given to check BP  F/u in 1 month.  Dash diet discussed and information sheet given.

## 2017-08-05 NOTE — Unmapped (Signed)
Patient ID: Brenda Mccarty is a 61 y.o. female who presents for follow up of HTN, breast cancer and right hip pain.    Informant: Patient came to appointment alone.    Assessment/Plan:      Regulatory affairs officer  The following preventive services were advised:  Colon screening: ordered  Diabetes screening: ordered  Lipid screening: ordered  Pap: deferred until later visit    Advance care planning:  -- Patient was given a copy of a living will to review at home and return for our files.      Other medical matters addressed today  Malignant neoplasm of overlapping sites of right female breast (CMS-HCC)  [facility] notes reviewed.  Invasive adenocarcinoma of the right breast.  ER+ and HER-  Has a port and will start chemo on 08/06/17    Metastatic breast cancer (CMS-HCC)  Liver biopsy c/w metastatic breast cancer.  Multifocal liver and bone metastasis.  Possible pulmonary involvement.  Started on Palbociclib and letrozole on 07/30/17.  Port in place.    Right low back pain  Pain most likely due to bone mets.  She does not want to be on Tramadol.  Start using icy hot patches, ice and heating pack and warm baths.  Use Tylenol 500 mg tid prn until you see oncology.  F/u prn     Overweight (BMI 25.0-29.9)  Weight is currently improving nicely  Diet: Refer to Nutrition Education services  Exercise: Work independently toward 45 minutes of exercise at least five days a week   She would like to lose weight but I told her to hold off for now. She would like a referral to nutritionist.      Essential hypertension  Blood pressure is at goal below 130/80  Medications: decrease lisinopril to 5 mg qd.  Labs: lipid levels  Log sheet given to check BP  F/u in 1 month.  Dash diet discussed and information sheet given.      Return in about 4 weeks (around 09/01/2017) for Recheck BP.       Subjective:     Problem-based concerns today  61 year old female with metastatic breast cancer presents for a CPE and with right hip pain.    She presented to urgent care in sept  2018 with breast swelling . Both breast were swollen and then subsided. Then right beast was inflamed and she found a mass in her breast.  She has had a liver biopsy about a week ago that is consistent with metastatic breast cancer.  She has pulmonary nodules that are most likely metastatic lesions also. She has had some weight loss. No fever or chills.    She has right hip pain.  X-ray shows lytic lesions.  She was started on tramadol but does not want to take this for pain.  She was told not to take thousand milligrams of Tylenol 3 times a day for pain.  She would like alternative methods of pain control.   Pain under good control with tylenol prn.      Hypertension update:  Overall doing well, without concerns. She is worried that BP is too low.  Casually attempting a daily exercise routine.    Casually attempting a low-salt diet.  No headache, chest pain or shortness of breath.  No light-headedness, muscle cramping, cough or rash.  Today's BP: 102/64.  Readings at home have not been tracked.  Most recent labs show:  Lab Results   Component Value Date  NA 138 07/23/2017    K 4.5 07/23/2017    CREATININE 0.74 07/23/2017     ROS  A comprehensive review of systems was conducted with negative results except as noted in the problem-based concerns above.    No Known Allergies    Outpatient Medications Prior to Visit   Medication Sig Dispense Refill   ??? acetaminophen (TYLENOL) 500 MG tablet Take 500 mg by mouth.     ??? letrozole (FEMARA) 2.5 mg tablet Take 1 tablet (2.5 mg total) by mouth daily. 30 tablet 11   ??? palbociclib (IBRANCE) 125 mg capsule Take 1 capsule (125 mg total) by mouth daily. for 21 days 21 days then 7 days off. Take with food. 30 capsule 0   ??? lisinopril (PRINIVIL,ZESTRIL) 10 MG tablet Take by mouth.     ??? traMADol (ULTRAM) 50 mg tablet Take 1 tablet (50 mg total) by mouth every six (6) hours as needed for pain. (Patient not taking: Reported on 08/04/2017) 30 tablet 0     No facility-administered medications prior to visit.        Updated History  As part of today's comprehensive wellness visit, I have reviewed and updated the following portions of the patient's history in the electronic record: allergies, current medications, past medical history, past surgical history, past family history, past social history and active problem list.    Preventive Care  As part of today's comprehensive wellness visit, I have reviewed and updated standard preventive services and immunizations as documented in the electronic record. See recommendations above.    Social History     Social History Narrative    Lives alone    Employment: retail        Caffeine: none    Exercise: sometimes    Seatbelts: regularly    Cell phone while driving: rarely    Sunscreen: sometimes    Dental care: regularly    Eye care: overdue     Current Providers   Patient Care Team:  Deetta Perla, MD as PCP - General (Internal Medicine)  Lucienne Capers Misior, [facility] as Physician Assistant            Objective:       Vital Signs  BP 102/64  - Pulse 63  - Temp 36.4 ??C (97.6 ??F) (Oral)  - Ht 157.5 cm (5' 2)  - Wt 66.8 kg (147 lb 4.8 oz)  - SpO2 99%  - BMI 26.94 kg/m??      Exam  General: Middle-aged woman sitting comfortably in office chair.  EYES: Anicteric sclerae. Concentric irises with reactive pupils.   ENT: Auditory acuity intact to rubbed fingers.  Oropharynx is moist and without lesions.   NECK: Normal size and contour. Thyroid smooth and symmetric without focal nodules.   RESP: Relaxed respiratory effort. Clear to auscultation without wheezes or crackles.   CV: Regular rate and rhythm. Normal S1 and S2. No murmurs or gallops.  No lower extremity edema. Posterior tibial pulses are 2+ and symmetric.   GI: Normal abdominal bowel sounds. Soft, non-tender, and non-distended.   BREAST: Breast exam is performed by her oncologist.  GU: Vaginal exam is performed by her gynecologist  LYMPH: No enlarged cervical or supraclavicular nodes palpated.   MSK: Full upright posture, smooth and symmetric gait. All major joints show full range of motion without discomfort. Strength is 5/5 in the upper and lower extremities.   SKIN: No rashes or suspicious focal lesions noted. Port on left side of chest.  No pus seen. Healing well. No  Inflammation.  NEURO: Patellar reflexes 2+ and symmetric.   PSYCHIATRIC: Alert and oriented. Speech fluent and sensible. No psychomotor agitation or slowing.             [facility] Components:     Barriers to goals identified and addressed. Pertinent handouts were given today and reviewed with the patient as indicated.  The Care Plan and Self-Management goals have been included on the AVS and the AVS has been printed. Any outside resources or referrals needed at this time are noted above. Patient's current medications have been reviewed. Any new medications prescribed have been discussed, and side effects have been addressed. Have assessed the patient's understanding, response, and barriers to adherence to medications. Patient voiced understanding and all questions have been answered to satisfaction.     Note - This chart has been prepared using the Dragon voice recognition system. Typographical errors may have occurred. ??Attempts have been made to correct errors, however, inadvertent errors may persist.

## 2017-08-05 NOTE — Unmapped (Signed)
Weight is currently improving nicely  Diet: Refer to Nutrition Education services  Exercise: Work independently toward 45 minutes of exercise at least five days a week   She would like to lose weight but I told her to hold off for now. She would like a referral to nutritionist.

## 2017-08-05 NOTE — Unmapped (Signed)
Pain most likely due to bone mets.  She does not want to be on Tramadol.  Start using icy hot patches, ice and heating pack and warm baths.  Use Tylenol 500 mg tid prn until you see oncology.  F/u prn

## 2017-08-05 NOTE — Unmapped (Signed)
[facility] notes reviewed.  Invasive adenocarcinoma of the right breast.  ER+ and HER-  Has a port and will start chemo on 08/06/17

## 2017-08-05 NOTE — Unmapped (Addendum)
Liver biopsy c/w metastatic breast cancer.  Multifocal liver and bone metastasis.  Possible pulmonary involvement.  Started on Palbociclib and letrozole on 07/30/17.  Port in place.

## 2017-08-06 ENCOUNTER — Ambulatory Visit
Admission: RE | Admit: 2017-08-06 | Discharge: 2017-08-06 | Disposition: A | Discharge status: Home / Self Care | Payer: BC Managed Care – PPO

## 2017-08-06 ENCOUNTER — Ambulatory Visit
Admission: RE | Admit: 2017-08-06 | Discharge: 2017-08-06 | Disposition: A | Discharge status: Home / Self Care | Payer: BC Managed Care – PPO | Attending: MS" | Admitting: MS"

## 2017-08-06 DIAGNOSIS — Z8042 Family history of malignant neoplasm of prostate: Secondary | ICD-10-CM

## 2017-08-06 DIAGNOSIS — C50911 Malignant neoplasm of unspecified site of right female breast: Principal | ICD-10-CM

## 2017-08-06 DIAGNOSIS — C50919 Malignant neoplasm of unspecified site of unspecified female breast: Secondary | ICD-10-CM

## 2017-08-06 DIAGNOSIS — Z800 Family history of malignant neoplasm of digestive organs: Secondary | ICD-10-CM

## 2017-08-06 DIAGNOSIS — Z803 Family history of malignant neoplasm of breast: Secondary | ICD-10-CM

## 2017-08-06 NOTE — Unmapped (Signed)
Labs drawn via [place].    Port flushed and brisk blood return     Pt tolerated well.    Pt left walking from clinic

## 2017-08-06 NOTE — Unmapped (Signed)
[facility] - [place]    Patient Name: Brenda Mccarty  Patient Age: 61 y.o.  Encounter Date: 08/06/2017    Referring Provider:  Aris Everts, MD  [address]  [place] [id number] [facility] [facility]  [place], [place] [address]    Reason for Consultation:  Evaluate for hereditary cancer susceptibility    Primary Care Provider:  Harrold Donath, MD    Ms. Brenda Mccarty, a 61 y.o. female, is being seen at the [facility] due to a personal and family history of cancer. Ms. Mccarty presents to clinic today to discuss the possibility of a hereditary predisposition to cancer and whether genetic testing is warranted.    ASSESSMENT AND PLAN: [name] is a 61 y.o. female recently diagnosed with metastatic breast cancer at 38. Her family history is notable for early-onset breast cancer in two sisters and her niece. This history is somewhat suggestive of a hereditary predisposition to cancer. We therefore discussed the following:    1. We recommend [name] pursue testing of 11 genes associated with hereditary breast cancer, including BRCA1, BRCA2, and PALB2. [name] provided informed consent, and her blood sample will be sent to [facility]. Results should be available in approximately 2-3 weeks, at which point we will contact her by phone and address implications for her, as well as at-risk family members.    2. A positive result may indicate changes to [name]'s management. For example, if a pathogenic variant were identified in BRCA1/2, then PARP inhibitors could potentially be considered in the future. Identifying a BRCA1/2 pathogenic variant could also potentially impact any future surgical decision-making.  3. In the event of negative testing in [name], we would continue to recommend genetic counseling and testing for her sister and niece, who were diagnosed with early-onset breast cancers and may not have had comprehensive genetic testing.    HISTORY OF PRESENT ILLNESS:  [name] was diagnosed with right breast cancer at 38. The IDC breast tumor is ER+/PR-/Her2-. Unfortunately, [name] was recently found to have metastatic disease. For full family history information, please see below. In brief, [name] has a significant family history of early-onset breast cancer. [name] late sister Jari Sportsman was diagnosed at ~65 and passed away at 58; she did not pursue genetic testing to [name]'s knowledge. Another sister Darl Pikes, now ~61, was diagnosed with breast cancer at 22; she reportedly had negative genetic testing around the time of her diagnosis, but this was notably >10 years ago and it is thus unclear how comprehensive this testing was. Her niece through Darl Pikes, now ~48, was reportedly diagnosed with breast cancer at 42; she also had negative genetic testing but this was similarly many years ago and exact testing remains unclear. Ms. Bierly also has a family history of pancreatic cancer in her late father. Maternally, her uncle and cousin were both diagnosed with prostate cancer.    [name] has not had hysterectomy or BSO. She took OCPs for ~10 years.    She had a colonoscopy at ~52, which revealed a benign polyp, type not recalled. Repeat colonoscopy was recommended in 5 years but not completed.    [name] lives with her fiance. She works in Engineering geologist at [employer].    Past Medical History:   Diagnosis Date   ??? Hypertension        Past Surgical History:   Procedure Laterality Date   ??? IR INSERT PORT  AGE GREATER THAN 5 YRS  07/22/2017    IR INSERT PORT AGE GREATER THAN 5 YRS 07/22/2017 Maree Erie, MD IMG VIR [facility]   ??? MYOMECTOMY         Social History     Social History   ??? Marital status: Single     Spouse name: N/A   ??? Number of children: N/A   ??? Years of education: N/A     Social History Main Topics   ??? Smoking status: Never Smoker   ??? Smokeless tobacco: Never Used   ??? Alcohol use No   ??? Drug use: No   ??? Sexual activity: Not on file Other Topics Concern   ??? Not on file     Social History Narrative    Lives alone    Employment: retail        Caffeine: none    Exercise: sometimes    Seatbelts: regularly    Cell phone while driving: rarely    Sunscreen: sometimes    Dental care: regularly    Eye care: overdue        FAMILY HISTORY:   During the visit, a 4-generation pedigree was obtained.    [name] does not have children.    [name] had nine siblings. Significant reported family history information includes the following:   ?? Sister Jari Sportsman, d. 71, diagnosed with breast cancer at ~86; she did not pursue genetic testing to Ms Cassidy's knowledge. She had one daughter and two sons (niece and nephews to [name]), age range ~28 to 30s, no cancer  ?? Sister Darl Pikes, ~61, diagnosed with breast cancer at 83; had negative genetic testing >10 years ago by report, exact analysis unclear. She has two daughters (nieces to [name]), one of whom is now ~40 and was diagnosed with breast cancer at 67 - she also had negative genetic testing several years ago by report, exact analysis unclear; other daughter is ~35, no cancer  ?? Sister, ~65, diagnosed with kidney cancer at ~17; non-smoker  ?? Brother, d. 49, sarcoidosis  ?? Other relatives with no known cancer diagnoses include: three sisters (ages ~17, 52, and 24); two brothers (~48 and 35); multiple nieces and nephews    Significant reported maternal family history information includes the following:  ?? Mother, d. ~69, MI; no cancer  ?? Uncle, 80, diagnosed with prostate cancer at unknown age  ?? Uncle, d. ~60, diagnosed with liver cancer at ~58  ?? Aunt, d. ~80s, no cancer. She had 12 children (cousins to [name]); one son, now ~62, was diagnosed with prostate cancer at ~73  ?? Other relatives with no known cancer diagnoses include: three uncles (two d. >50, one now ~71); two additional aunts (~70s to 92s); grandparents (d. ~5s to 67s)    Significant reported paternal family history information includes the following:  ?? Father, d. ~24, diagnosed with pancreatic cancer at ~33; smoker  ?? Uncle, d. ~54s, diagnosed with unknown type of cancer at unknown age  ?? Aunt, d. ~70s, no cancer She had two daughters (cousins to Ms. Lauderback), one of whom is now in her ~67s and was diagnosed with breast cancer at an unknown age; no information about other daughter  ?? Grandmother, d. younger age, childbirth-related; no cancer  ?? Grandfather, d. ~50, unknown cause; no cancer  ?? Other relatives with no known cancer diagnoses include: one additional aunt (d. 23); three uncles (d. >40)    Ms. Cloyd's ancestry is African-American. There is  no known Jewish ancestry.    DISCUSSION: Ms. Mctague is a 61 y.o. female recently diagnosed with metastatic breast cancer at 63. Her family history is significant for early-onset breast cancer in her late sister, who was diagnosed at ~35; her living sister, who was diagnosed at 69; and her niece, who was diagnosed at 24. This history is somewhat suggestive of a hereditary predisposition to breast cancer. We therefore recommend Ms. Ernster pursue testing of 11 genes associated with hereditary breast cancer. Her test results could have important implications for her management and/or her relatives.    Ms. Lingle reported that her late sister who was diagnosed with breast cancer at ~48 did not pursue genetic testing. She believes that her living sister and her niece had negative genetic testing. However, a copy of their test results was not immediately available for review. These individuals were likely tested >10 years ago, which calls into question how comprehensive their testing was; their testing may not have even included BRCA1/2 deletion/duplication analysis, let alone testing of other highly penetrant breast cancer predisposition genes of interest, such as PALB2.    We reviewed the characteristics, features, and inheritance patterns of hereditary cancer syndromes. We also discussed genetic testing, including the process of testing, insurance coverage, and implications of results.     Most hereditary breast cancer is associated with pathogenic variants in BRCA1/2. Women who carry a pathogenic variant in BRCA1/2 are at significantly increased lifetime risk for breast cancer, on the order of perhaps ~38-84% lifetime risk (vs. general population risk of ~12%). Women who have already been diagnosed with breast cancer are at increased risk for developing a second primary breast cancer. Women who carry a BRCA1/2 pathogenic variant are also at significantly increased lifetime risk for ovarian cancer, on the order of perhaps ~17-27% (vs. general population risk of ~1-2%). Pathogenic variants in BRCA are also associated with modestly increased risks for other types of cancer, including pancreatic cancer in men and women, and prostate cancer in men. This is of note given Ms. Schwan's reported family history.    Identifying a BRCA1/2 pathogenic variant in Ms. Lindor could have important implications for her management. PARP inhibitors may be considered as part of the treatment plan for women who have breast cancer and carry a pathogenic BRCA1/2 variant. Women who have been diagnosed with breast cancer and carry a BRCA1/2 pathogenic variant may also consider bilateral mastectomy based on the increased risk for a second primary breast cancer.    Transvaginal ultrasound and CA-125 can be considered for ovarian cancer screening in BRCA1/2 positive women, but neither tool has been shown to be effective in detecting this cancer at an early stage. As such, prophylactic BSO is recommended.    Hereditary breast cancer has also been associated with pathogenic variants in several other genes, including high penetrance genes like PALB2, TP53, CDH1, and PTEN. Each of these genes has specific management guidelines, and identifying a pathogenic variant in one of them may impact Ms. Collings???s management. The PALB2 gene is of particular interest in Ms. Bunte's case given the reported family history of breast and pancreatic cancer.    Several low/moderate penetrance genes have also been linked with slightly increased risks for breast cancer, including ATM and CHEK2. We discussed that identifying a pathogenic variant in one of these genes would not likely significantly alter Ms. Erck's management at this time. However, identifying certain pathogenic variants in these genes may provide additional information for relatives. Ms. Whitmoyer voiced understanding of this.  We discussed that in the event of negative genetic testing in Ms. [place], we would continue to recommend her affected sister and niece explore updated genetic counseling and testing, as it would be possible that Ms. Swallow would represent a phenocopy in this family. Ms. Feild stated she will speak with her sister and niece and try to obtain a copy of their previous test reports.     We encouraged Ms. Comrie to remain in contact with Cancer Genetics annually so that we can update the family history and inform her of any changes in cancer genetics and testing that may be of benefit for this family. Ms. Pesch questions were answered to her satisfaction today. Our contact information [phone]) was provided should additional questions or concerns arise. Thank you for the referral and allowing [place] to share in the care of your patient.     This patient was discussed with Vernell Morgans, MD, PhD and he agrees with the above assessment and plan. Total time spent by Alric Ran, MS, CGC in face-to-face counseling was approximately 45 minutes.

## 2017-08-20 ENCOUNTER — Ambulatory Visit: Admission: RE | Admit: 2017-08-20 | Discharge: 2017-08-20 | Disposition: A | Discharge status: Home / Self Care

## 2017-08-20 ENCOUNTER — Ambulatory Visit
Admission: RE | Admit: 2017-08-20 | Discharge: 2017-08-20 | Disposition: A | Discharge status: Home / Self Care | Attending: Family | Admitting: Family

## 2017-08-20 DIAGNOSIS — Z0000 Encounter for general adult medical examination without abnormal findings: Secondary | ICD-10-CM

## 2017-08-20 DIAGNOSIS — C50919 Malignant neoplasm of unspecified site of unspecified female breast: Secondary | ICD-10-CM

## 2017-08-20 DIAGNOSIS — C50811 Malignant neoplasm of overlapping sites of right female breast: Principal | ICD-10-CM

## 2017-08-20 DIAGNOSIS — Z1159 Encounter for screening for other viral diseases: Secondary | ICD-10-CM

## 2017-08-20 DIAGNOSIS — Z170 Estrogen receptor positive status [ER+]: Secondary | ICD-10-CM

## 2017-08-20 LAB — CBC W/ AUTO DIFF
BASOPHILS ABSOLUTE COUNT: 0 10*9/L (ref 0.0–0.1)
EOSINOPHILS ABSOLUTE COUNT: 0 10*9/L (ref 0.0–0.4)
HEMATOCRIT: 29.2 % — ABNORMAL LOW (ref 36.0–46.0)
HEMOGLOBIN: 9.5 g/dL — ABNORMAL LOW (ref 12.0–16.0)
LARGE UNSTAINED CELLS: 8 % — ABNORMAL HIGH (ref 0–4)
LYMPHOCYTES ABSOLUTE COUNT: 1 10*9/L — ABNORMAL LOW (ref 1.5–5.0)
MEAN PLATELET VOLUME: 8.8 fL (ref 7.0–10.0)
MONOCYTES ABSOLUTE COUNT: 0 10*9/L — ABNORMAL LOW (ref 0.2–0.8)
NEUTROPHILS ABSOLUTE COUNT: 0.9 10*9/L — ABNORMAL LOW (ref 2.0–7.5)
PLATELET COUNT: 161 10*9/L (ref 150–440)
RED BLOOD CELL COUNT: 2.95 10*12/L — ABNORMAL LOW (ref 4.00–5.20)
RED CELL DISTRIBUTION WIDTH: 17.9 % — ABNORMAL HIGH (ref 12.0–15.0)
WBC ADJUSTED: 2.1 10*9/L — ABNORMAL LOW (ref 4.5–11.0)

## 2017-08-20 LAB — HDL CHOLESTEROL: Cholesterol.in HDL:MCnc:Pt:Ser/Plas:Qn:: 51

## 2017-08-20 LAB — LIPID PANEL
CHOLESTEROL/HDL RATIO SCREEN: 2.1 (ref <5.0)
CHOLESTEROL: 109 mg/dL (ref 100–199)
LDL CHOLESTEROL CALCULATED: 49 mg/dL — ABNORMAL LOW (ref 60–99)
TRIGLYCERIDES: 43 mg/dL (ref 1–149)
VLDL CHOLESTEROL CAL: 8.6 mg/dL — ABNORMAL LOW (ref 11–40)

## 2017-08-20 LAB — CREATININE: CREATININE: 0.99 mg/dL (ref 0.60–1.00)

## 2017-08-20 LAB — LARGE UNSTAINED CELLS: Lab: 8 — ABNORMAL HIGH

## 2017-08-20 LAB — EGFR MDRD NON AF AMER
Glomerular filtration rate/1.73 sq M.predicted.non black:ArVRat:Pt:Ser/Plas/Bld:Qn:Creatinine-based formula (MDRD): 57 — ABNORMAL LOW

## 2017-08-20 LAB — SMEAR REVIEW

## 2017-08-20 LAB — CALCIUM: Calcium:MCnc:Pt:Ser/Plas:Qn:: 9.9

## 2017-08-20 LAB — GLUCOSE FASTING: Glucose^post CFst:MCnc:Pt:Ser/Plas:Qn:: 88

## 2017-08-20 LAB — HEPATITIS C ANTIBODY: Hepatitis C virus Ab:PrThr:Pt:Ser:Ord:: NONREACTIVE

## 2017-08-20 NOTE — Unmapped (Addendum)
It was a pleasure seeing you today.   Continue with the current plan of care.  Follow-up in 1 weeks for CBC, 4 weeks with Dr. Marchelle Folks.    No orders of the defined types were placed in this encounter.      Labs from today if done:  Lab on 08/20/2017   Component Date Value   ??? Creatinine 08/20/2017 0.99    ??? EGFR MDRD Af Amer 08/20/2017 >=60    ??? EGFR MDRD Non Af Amer 08/20/2017 57*   ??? Calcium 08/20/2017 9.9    ??? Glucose, Fasting 08/20/2017 88    ??? Triglycerides 08/20/2017 43    ??? Cholesterol 08/20/2017 109    ??? HDL 08/20/2017 51    ??? LDL Calculated 08/20/2017 49*   ??? VLDL Cholesterol Cal 08/20/2017 8.6*   ??? Chol/HDL Ratio 08/20/2017 2.1    ??? Non-HDL Cholesterol 08/20/2017 58    ??? FASTING 08/20/2017 Yes         Future Appointments:  Future Appointments  Date Time Provider Department Center   08/20/2017 9:30 AM ONCINF CHAIR 40 [id number]   09/02/2017 8:40 AM Sangeeta Van Clines, MD [facility]       For appointments call [phone]  For new symptoms or health related issues, please call   Nurse Navigator: Dan Maker, RN [phone]  On weekends and after hours on weekdays, please call [phone] for the oncology fellow on call.      If you use [facility], please know that I check messages only twice a week. If your message is urgent, please call or page your nurse navigator or the on-call fellow.

## 2017-08-20 NOTE — Unmapped (Addendum)
Interval Visit Note    Patient Name: Brenda Mccarty  Patient Age: 61 y.o.  Encounter Date: 08/20/2017    [id number] [name], MD  REFERRING MD:  Dr Lucretia Roers    HPI:  The patient is a 60 year old woman whom I first met 1 week ago. At that time she had presented with a large clinical T3 pathologic N1 right breast cancer which was ER positive PR negative HER-2 negative. Scan from 07/22/17 showed bone scan was diffusely positive and she has apparent pulmonary and liver metastases.    Interval History:  Here with husband.  Recently diagnosed with metastatic disease to the bone, liver, lung.  Started her letrozole and Ibrance November 9.  She will start her off week tomorrow.    Grade 1 constipation times 1 week-no new changes in diet.  Noticed within 2 days of letrozole-LBP pain level ranges from 4/10-7/10.  Using over-the-counter analgesics for mild relief.  Right breast feels softer-but still causing intermittent breast pain.  Since Ilda Foil has noticed increased fatigue, requiring small breaks throughout the day.  Also reports mild hot flashes, insomnia, 1 pound weight gain, and itching to her right breast.  Reports 2 days worth of sore throat and mild cough.  No fever reported.  She is aware of neutropenic precautions.  Still able to complete all  ADLs/IADLs.  Continues to work at [employer].  Performance status=1    ROS: She has filled out the [facility] [id number] form which I have reviewed with her and filed in med rec . Comprehensive 12 point ROS is performed and is negative except as noted above.       ONCOLOGY HX:    Oncology History    57 postmenopausal yF  With presenting stage 4 breast cancer 07/2017  ER+ PR- HER2-         Malignant neoplasm of overlapping sites of right female breast (CMS-HCC)    05/2017 -  Presenting Symptoms     End of Sept pt noticed b/l breast swelling, 1 week later left breast swelling resolved but right breast swelling worsened.   Last that week noticed non-erythematous-painful skin dimpling to the lateral right breast. She also palpated an intermittent mass in the right breast with baseline inverted nipples that seemed more pulled in on the right. Two weeks later presented to Urgent Care, prompting MMG/US.            06/24/2017 Interval Scan(s)     B/L Dx MMG: increased right breast density and ill-defined mass @9 :00 with associated posterior architectural distortion. Benign calcs B/L.   Left breast clear  [place]: Right breast 2.5 cm irregular mass at 9:00 middle depth.          06/29/2017 Biopsy     Right breast Bx: 9:00 7CFN, IDC-G2 with DCIS-G3, LVI  Right axillary LN Bx: IDC, G3         06/30/2017 -  Other     New pt consult-Duke Fuller Canada, MD.   PE showed Rt enlarged-retracted-elevated breast, large irregularly shaped mobile mass along entire lateral aspect of breast from 7-1:00, nipple inversion Rt>Lt, skin dimpling inferior nipple, +Rt axillary LN.  RECS: B/L MRI breast and genetics referral.          07/12/2017 -  Other     Flushing slide review:  A. Breast, right, site #1, 9:00, 7 cm from nipple, ultrasound-guided needle core biopsy  - Invasive ductal carcinoma of the breast (see diagnosis comment)  - Nottingham combined histologic  grade: 2  - Tubule formation score: 3  - Nuclear pleomorphism score: 2  - Mitotic rate score: 1  - Largest contiguous size: 1.2 cm  - Lymphovascular invasion: Not identified  - Microcalcifications: Present in invasive carcinoma  - In situ carcinoma: Present; ductal carcinoma in situ, solid and comedo types  - Per outside report, ancillary studies:  - Estrogen receptor: Positive (94%, 3+)  - Progesterone receptor: Negative (<1%, 1+)  - HER2 (by IHC): Negative (1+)  ??  B. 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 07/27/2017 Biopsy     Liver core biopsy-involved by carcinoma C/W breast metastatic carcinoma.  ER 95% PR negative HER-2 negative.         07/30/2017 -  Other     Started Letrozole and Ibrance.          08/04/2017 Genetics     STRATA:  ??? CDKN2A deep deletion  Estimated copy number: 0, confidence interval: 0.4 - 0.6  ??? FGFR1 amplification  See Medical Director Note  ??? PTEN deep deletion  See Medical Director Note  ??? TP53 [id number].[id number]  [id number].[id number]>T  Estimated variant allele frequency: 46%         08/06/2017 Genetics     [facility] germline testing: Negative          Review of Systems:  She has filled out the [facility] [id number] form which I have reviewed with her and filed in med rec . Comprehensive 12 point ROS is performed and is negative except as noted above.      Meds:  Current Outpatient Prescriptions:   ???  acetaminophen (TYLENOL) 500 MG tablet, Take 500 mg by mouth., Disp: , Rfl:   ???  letrozole (FEMARA) 2.5 mg tablet, Take 1 tablet (2.5 mg total) by mouth daily., Disp: 30 tablet, Rfl: 11  ???  lisinopril (PRINIVIL,ZESTRIL) 5 MG tablet, Take 1 tablet (5 mg total) by mouth daily., Disp: 90 tablet, Rfl: 3  ???  traMADol (ULTRAM) 50 mg tablet, Take 1 tablet (50 mg total) by mouth every six (6) hours as needed for pain. (Patient not taking: Reported on 08/04/2017), Disp: 30 tablet, Rfl: 0  No current facility-administered medications for this visit.     ALLERGIES: No Known Allergies  PAST MEDICAL HX:  Past Medical History:   Diagnosis Date   ??? Hypertension      SOCIAL HX:  Here today with her husband . She has no children  She works in Engineering geologist    FAMHX:  2 sisters with breast cancer in their 22's   Genetic testing negative    Physical Exam:  Vital Signs for this encounter:  Vitals:    08/20/17 0833   BP: 147/73   Pulse: 59   Resp: 18   Temp: 36.5 ??C (97.7 ??F)   SpO2: 100%     BSA: 1.72 meters squared  General:  Normal appearing female in no acute distress..  Cardiovascular:  Heart not clinically enlarged.  No significant murmurs or gallops.  No lower extremity edema.    Respiratory:  Chest clear to percussion and auscultation.   Gastrointestinal:  Abdomen soft without masses and tenderness, no hepatosplenomegaly or other masses.   Musculoskeletal:  No bony pain  or tenderness.   Skin/Subcutaneous Tissues:  Negative. No rash, ecchymoses, purpuric lesions noted.  Psychiatric:  Alert and oriented.  Mood is normal.  No other symptoms.   Upper Extremity Lymphedema: None  Breast and regional nodes: large fixed right breast mass at least 10x 11 cm-mass feels softer on exam. No cervical/axillary/supraclavicular lymphadenopathy appreciated-(previously Rt axillary +LN).     Orders/Results:    Office Visit on 08/20/2017   Component Date Value   ??? WBC 08/20/2017 2.1*   ??? RBC 08/20/2017 2.95*   ??? HGB 08/20/2017 9.5*   ??? HCT 08/20/2017 29.2*   ??? MCV 08/20/2017 98.8    ??? MCH 08/20/2017 32.2    ??? MCHC 08/20/2017 32.6    ??? RDW 08/20/2017 17.9*   ??? MPV 08/20/2017 8.8    ??? Platelet 08/20/2017 161    ??? Absolute Neutrophils 08/20/2017 0.9*   ??? Absolute Lymphocytes 08/20/2017 1.0*   ??? Absolute Monocytes 08/20/2017 0.0*   ??? Absolute Eosinophils 08/20/2017 0.0    ??? Absolute Basophils 08/20/2017 0.0    ??? Large Unstained Cells 08/20/2017 8*   ??? Macrocytosis 08/20/2017 Moderate*   ??? Anisocytosis 08/20/2017 Slight*   ??? Smear Review Comments 08/20/2017 See Comment*       Assessment/Plan:  1. Metastatic breast cancer (CMS-HCC)-HR+ HER2 negative, which was biopsy proven in the liver on 07/27/17. Per imaging, bone and lung involvement also present. She was is started on Zometa, Ibrance and Letrozole Nov 2018, which she is tolerating with mild fatigue and arthralgias.     Treatment goals:  Palliative-patient and her husband completed a balance discussion along with Dr. Avis Epley, reviewing the incurable nature of metastatic breast cancer. However, the response to targeted therapies for hormone receptor positive HER-2 negative breast cancer can be quite variable, and she is expected to benefit greatly from this therapy.    Systemic Therapy:  Continue Ibrance and letrozole, ANC 0.9 today-otherwise remainder of labs stable.  Continue Zometa monthly, tolerating well  Neutropenic precautions  Recheck CBC in 1 week    Symptoms Mgmt:  Low back and breast pain, x-rays show lytic lesions to lumbar spine and superior medial right iliac bone. May use NSAIDs, consider radiation  Constipation, start Colace  Fatigue, start multivitamin with iron  URI, monitor for fever, can start over-the-counter Zyrtec or Claritin    Genetics:  Germline testing negative  STRATA, possible NCI match phase 1 clinical trial targeted for FGFR-1, messages sent to inquire for candidacy/enrollment.    Follow-up:  1 week with CBC, 4 weeks provider, labs, Zometa    I spent at least 40 minutes with this patient more than 50% in counseling. The following issues were discussed:

## 2017-08-20 NOTE — Unmapped (Signed)
Port accessed and labs sent without complication. Patient left lab draw area in stable condition, steady gait.

## 2017-08-20 NOTE — Unmapped (Signed)
Results for Brenda Mccarty, Brenda Mccarty (MRN [id number]) as of 08/20/2017 12:04   Ref. Range 08/20/2017 07:59 08/20/2017 09:13   WBC Latest Ref Range: 4.5 - 11.0 10*9/L  2.1 (L)   RBC Latest Ref Range: 4.00 - 5.20 10*12/L  2.95 (L)   HGB Latest Ref Range: 12.0 - 16.0 g/dL  9.5 (L)   HCT Latest Ref Range: 36.0 - 46.0 %  29.2 (L)   MCV Latest Ref Range: 80.0 - 100.0 fL  98.8   MCH Latest Ref Range: 26.0 - 34.0 pg  32.2   MCHC Latest Ref Range: 31.0 - 37.0 g/dL  81.1   RDW Latest Ref Range: 12.0 - 15.0 %  17.9 (H)   MPV Latest Ref Range: 7.0 - 10.0 fL  8.8   Platelet Latest Ref Range: 150 - 440 10*9/L  161   Absolute Neutrophils Latest Ref Range: 2.0 - 7.5 10*9/L  0.9 (L)   Absolute Lymphocytes Latest Ref Range: 1.5 - 5.0 10*9/L  1.0 (L)   Absolute Monocytes  Latest Ref Range: 0.2 - 0.8 10*9/L  0.0 (L)   Absolute Eosinophils Latest Ref Range: 0.0 - 0.4 10*9/L  0.0   Absolute Basophils  Latest Ref Range: 0.0 - 0.1 10*9/L  0.0   Macrocytosis Latest Ref Range: Not Present   Moderate (A)   Anisocytosis Latest Ref Range: Not Present   Slight (A)   Smear Review Latest Ref Range: Undefined   See Comment (A)   Large Unstained Cells Latest Ref Range: 0 - 4 %  8 (H)   Creatinine Latest Ref Range: 0.60 - 1.00 mg/dL 9.14    EGFR MDRD Non Af Amer Latest Ref Range: >=60 mL/min/1.21m2 57 (L)    EGFR MDRD Af Amer Latest Ref Range: >=60 mL/min/1.19m2 >=60    Calcium Latest Ref Range: 8.5 - 10.2 mg/dL 9.9    Cholesterol Latest Ref Range: 100 - 199 mg/dL 782    Triglycerides Latest Ref Range: 1 - 149 mg/dL 43    HDL Latest Ref Range: 40 - 59 mg/dL 51    LDL Calculated Latest Ref Range: 60 - 99 mg/dL 49 (L)    Non-HDL Cholesterol Latest Units: mg/dL 58    Chol/HDL Ratio Latest Ref Range: <5.0  2.1    VLDL Cholesterol Cal Latest Ref Range: 11 - 40 mg/dL 8.6 (L)    FASTING Unknown Yes    Glucose, Fasting Latest Ref Range: 65 - 99 mg/dL 88        If you feel like you're having an emergency please call 911.  For appointments or questions Monday through Friday 8AM-5PM please call [phone] or Toll Free [phone]. For Medical questions or concerns ask for the Nurse Triage Line.  On Nights, Weekends, and Holidays call [phone] and ask for the Oncologist on Call.  Reasons to call the Nurse Triage Line:  Fever of 100.5 or greater  Nausea and/or vomiting not relived with nausea medicine  Diarrhea or constipation  Severe pain not relieved with usual pain regimen  Shortness of breath  Uncontrolled bleeding  Mental status changes

## 2017-08-20 NOTE — Unmapped (Signed)
Pt presented in NAD, no complaints, pain free, no negative changes since last visit. Port accessed prior to arrival to infusion center.+blood return    After infusion wished to wait for Johnette Abraham. NP to discuss results as she had not resulted prior to appointment. Pt stated after awhile she would reach out through MyChart. After leaving NP returned page and stated she would call her and discuss results.

## 2017-08-23 NOTE — Unmapped (Signed)
-----   Message from Marcy Panning, FNP sent at 08/21/2017  8:15 AM EST -----  Regarding: Lab Appt and Low ANC  Brenda Mccarty, I tried 4 times Friday to contact her with labs.   Maybe you'll have better luck?  Brenda Mccarty has a low ANC=900, which she is aware of.  (She will still stay on Ibrance)   -She is fairly new on [place]. Can you make sure she knows about precautions. The RN in infusion reviewed this a little with her on Friday.      Brenda Mccarty, we need to move up her lab appt to 08/27/17 from 12 /21/18 to re-check CBC. Order in the chart.    Thanks,  Brenda Mccarty

## 2017-08-23 NOTE — Unmapped (Signed)
[facility] Refill Coordination Note  Specialty Medication(s): Ilda Foil  Additional Medications shipped: [employer], DOB: 1956/08/25  Phone: [phone] (home) , Alternate phone contact: N/A  Phone or address changes today?: No  All above HIPAA information was verified with patient.  Shipping Address: [address] [place] [address]   Insurance changes? No    Completed refill call assessment today to schedule patient's medication shipment from the [facility] [phone]).      Confirmed the medication and dosage are correct and have not changed: Yes, regimen is correct and unchanged.    Confirmed patient started or stopped the following medications in the past month:  No, there are no changes reported at this time.    Are you tolerating your medication?:  Mickenzie reports tolerating the medication.    ADHERENCE    Did you miss any doses in the past 4 weeks? No missed doses reported.    FINANCIAL/SHIPPING    Delivery Scheduled: Yes, Expected medication delivery date: 08/25/17     Kathleena did not have any additional questions at this time.    Delivery address validated in [facility] scheduling system: Yes, address listed in [facility] is correct.    We will follow up with patient monthly for standard refill processing and delivery.      Thank you,  Marletta Lor   [facility] Specialty Pharmacist

## 2017-08-23 NOTE — Unmapped (Signed)
Call made to patient, per Brenda Mccarty, to discuss neutropenic precautions. Patient, nor her husband, answered at phone numbers on file. Will send a MyChart message.

## 2017-08-23 NOTE — Unmapped (Signed)
LVM for Brenda Mccarty asking her to return my call. Attempting to discuss genetic test results.

## 2017-08-24 MED ORDER — PALBOCICLIB 125 MG CAPSULE
ORAL_CAPSULE | Freq: Every day | ORAL | 0 refills | 0.00000 days | Status: CP
Start: 2017-08-24 — End: 2017-10-18

## 2017-08-24 MED ORDER — PALBOCICLIB 125 MG CAPSULE: each | 0 refills | 0 days

## 2017-08-24 MED FILL — IBRANCE/125MG/CAPS: IBRANCE/125MG/CAPS | 28 days supply | Qty: 21 | Fill #0

## 2017-08-27 ENCOUNTER — Ambulatory Visit
Admission: RE | Admit: 2017-08-27 | Discharge: 2017-08-27 | Disposition: A | Discharge status: Home / Self Care | Payer: BC Managed Care – PPO

## 2017-08-27 DIAGNOSIS — Z170 Estrogen receptor positive status [ER+]: Secondary | ICD-10-CM

## 2017-08-27 DIAGNOSIS — C50811 Malignant neoplasm of overlapping sites of right female breast: Principal | ICD-10-CM

## 2017-08-27 DIAGNOSIS — C50919 Malignant neoplasm of unspecified site of unspecified female breast: Secondary | ICD-10-CM

## 2017-08-27 LAB — COMPREHENSIVE METABOLIC PANEL
ALBUMIN: 4.5 g/dL (ref 3.5–5.0)
ALKALINE PHOSPHATASE: 94 U/L (ref 38–126)
ALT (SGPT): 35 U/L (ref 15–48)
ANION GAP: 9 mmol/L (ref 9–15)
AST (SGOT): 39 U/L — ABNORMAL HIGH (ref 14–38)
BILIRUBIN TOTAL: 0.6 mg/dL (ref 0.0–1.2)
BLOOD UREA NITROGEN: 14 mg/dL (ref 7–21)
BUN / CREAT RATIO: 19
CHLORIDE: 107 mmol/L (ref 98–107)
CO2: 27 mmol/L (ref 22.0–30.0)
CREATININE: 0.73 mg/dL (ref 0.60–1.00)
EGFR MDRD AF AMER: >=60 mL/min/{1.73_m2} (ref >=60)
EGFR MDRD NON AF AMER: >=60 mL/min/{1.73_m2} (ref >=60)
GLUCOSE RANDOM: 91 mg/dL (ref 65–179)
POTASSIUM: 4.3 mmol/L (ref 3.5–5.0)
PROTEIN TOTAL: 7.6 g/dL (ref 6.5–8.3)
SODIUM: 143 mmol/L (ref 135–145)

## 2017-08-27 LAB — SMEAR REVIEW

## 2017-08-27 LAB — CBC W/ AUTO DIFF
BASOPHILS ABSOLUTE COUNT: 0 10*9/L (ref 0.0–0.1)
EOSINOPHILS ABSOLUTE COUNT: 0 10*9/L (ref 0.0–0.4)
HEMATOCRIT: 28.7 % — ABNORMAL LOW (ref 36.0–46.0)
HEMOGLOBIN: 9.4 g/dL — ABNORMAL LOW (ref 12.0–16.0)
LYMPHOCYTES ABSOLUTE COUNT: 1.3 10*9/L — ABNORMAL LOW (ref 1.5–5.0)
MEAN CORPUSCULAR HEMOGLOBIN: 33.4 pg (ref 26.0–34.0)
MEAN CORPUSCULAR VOLUME: 101.4 fL — ABNORMAL HIGH (ref 80.0–100.0)
MEAN PLATELET VOLUME: 8.8 fL (ref 7.0–10.0)
MONOCYTES ABSOLUTE COUNT: 0.2 10*9/L (ref 0.2–0.8)
NEUTROPHILS ABSOLUTE COUNT: 0.9 10*9/L — ABNORMAL LOW (ref 2.0–7.5)
PLATELET COUNT: 313 10*9/L (ref 150–440)
RED CELL DISTRIBUTION WIDTH: 20.4 % — ABNORMAL HIGH (ref 12.0–15.0)
WBC ADJUSTED: 2.5 10*9/L — ABNORMAL LOW (ref 4.5–11.0)

## 2017-08-27 LAB — POTASSIUM: Potassium:SCnc:Pt:Ser/Plas:Qn:: 4.3

## 2017-08-27 LAB — MEAN CORPUSCULAR HEMOGLOBIN CONC: Lab: 32.9

## 2017-08-27 NOTE — Unmapped (Signed)
Port accessed, labs drawn and sent, port deaccessed.  No issues reported.  Discharged from lab draw area home.

## 2017-08-31 NOTE — Unmapped (Signed)
-----   Message from Kern Reap, RN sent at 08/30/2017  8:33 AM EST -----  Regarding: RE: ANC-Ibrance  Hi guys!  Thanks for your help with this. I ended up not having luck either when I called last week, so I had sent her a MyChart message.  Thanks again,  Lea    ----- Message -----  From: Sanjuana Letters, RN  Sent: 08/27/2017   3:48 PM  To: Agapito Games, RN, #  Subject: RE: ANC-Ibrance                                  I attempted a call at 3:48pm. No answer, did not leave message on vm.  Amy  ----- Message -----  From: Agapito Games, RN  Sent: 08/27/2017   2:52 PM  To: Willeen Niece, MD, Amy Oneita Jolly, RN, #  Subject: RE: Sanjuana Letters is out today - I tried to call pt but no response.   Can you or Amy please try her again before the end of the day as I am logging out shortly...    Thanks!  Britta Mccreedy   ----- Message -----  From: Marcy Panning, FNP  Sent: 08/27/2017   2:19 PM  To: Willeen Niece, MD, Kern Reap, RN  Subject: Loleta Dicker call Ms. Sopp and let her know she is still neutropenic ANC-0.9. I want her to hold her Ibrance (------she should be on her off week-please confirm) for the coming week and we'll recheck at that time. I will order CBC, but she will need a lab appt.    Thanks,  Marisue Ivan

## 2017-09-02 ENCOUNTER — Ambulatory Visit: Admission: RE | Admit: 2017-09-02 | Discharge: 2017-09-02

## 2017-09-02 DIAGNOSIS — Z0000 Encounter for general adult medical examination without abnormal findings: Secondary | ICD-10-CM

## 2017-09-02 DIAGNOSIS — I10 Essential (primary) hypertension: Principal | ICD-10-CM

## 2017-09-02 MED ORDER — LISINOPRIL 5 MG TABLET
ORAL_TABLET | Freq: Every day | ORAL | 3 refills | 0 days | Status: CP
Start: 2017-09-02 — End: 2017-12-12

## 2017-09-02 NOTE — Unmapped (Signed)
Thanks for choosing [facility] [place]  for your medical care!    If you have any questions about your visit today, please call us at [phone].      For medication refills, please have your pharmacist send an electronic refill request    If you need care after 5:00 pm during the week or on the weekend:  Call [facility] at [phone] for nurse/physician advice or...    Go to [facility] walk-in clinic [address], [place] [phone] -- Open 7 days a week from 9:00AM - 8:00PM        We will always try to notify you of the results from laboratory tests within ten days of the study.  If you do not hear from us by phone, letter, or electronic message, please call the office immediately for further information.         Before your next visit:  --   Please keep a list of any questions you may have so that we can discuss them together.     Goals    None

## 2017-09-03 NOTE — Unmapped (Signed)
Patient ID: Brenda Mccarty is a 61 y.o. female who presents for comprehensive wellness evaluation and follow up of hypertension.    Informant: Patient came to appointment alone.    Assessment/Plan:      Regulatory affairs officer  The following preventive services were advised:  Colon screening: ordered  Pap: deferred until later visit          Other medical matters addressed today  Essential hypertension  Blood pressure is at goal below 130/80  Medications: Continue current medications  Labs: no labs needed today  Doing very well on lower dose of lisinopril.  BMP done 1 week ago is wnl      Return in about 1 year (around 09/02/2018) for Annual physical.       Subjective:     Problem-based concerns today  61 year old presents for a CPE and for BP follow up.  Lower dose of lisinopril is working well.    Hypertension update:  Overall doing well, without concerns.  Improving efforts at a daily exercise routine.    Improving efforts at a low-salt diet.  No headache, chest pain or shortness of breath.  No light-headedness, muscle cramping, cough or rash.  Today's BP: 120/79.  Readings at home have been similar.  Most recent labs show:  Lab Results   Component Value Date    NA 143 08/27/2017    K 4.3 08/27/2017    CREATININE 0.73 08/27/2017       ROS  A comprehensive review of systems was conducted with negative results except as noted in the problem-based concerns above.    No Known Allergies    Outpatient Medications Prior to Visit   Medication Sig Dispense Refill   ??? acetaminophen (TYLENOL) 500 MG tablet Take 500 mg by mouth.     ??? letrozole (FEMARA) 2.5 mg tablet Take 1 tablet (2.5 mg total) by mouth daily. 30 tablet 11   ??? MULTI-VITAMIN WITH IRON ORAL Take 1 tablet by mouth daily.     ??? palbociclib (IBRANCE) 125 mg capsule Take 1 capsule (125 mg total) by mouth daily. For 21 days and then 7 days off 21 capsule 0   ??? traMADol (ULTRAM) 50 mg tablet Take 1 tablet (50 mg total) by mouth every six (6) hours as needed for pain. 30 tablet 0   ??? lisinopril (PRINIVIL,ZESTRIL) 5 MG tablet Take 1 tablet (5 mg total) by mouth daily. 90 tablet 3     No facility-administered medications prior to visit.        Updated History  As part of today's comprehensive wellness visit, I have reviewed and updated the following portions of the patient's history in the electronic record: allergies, current medications, past medical history, past surgical history, past family history, past social history and active problem list.    Preventive Care  As part of today's comprehensive wellness visit, I have reviewed and updated standard preventive services and immunizations as documented in the electronic record. See recommendations above.    Social History     Social History Narrative    Lives alone    Employment: retail        Caffeine: none    Exercise: sometimes    Seatbelts: regularly    Cell phone while driving: rarely    Sunscreen: sometimes    Dental care: regularly    Eye care: overdue     Current Providers   Patient Care Team:  Deetta Perla, MD as PCP - General (Internal  Medicine)  Lucienne Capers Misior, [facility] as Physician Assistant            Objective:       Vital Signs  BP 120/79  - Pulse 66  - Temp 36.9 ??C (98.4 ??F)  - Ht 157.5 cm (5' 2)  - Wt 67 kg (147 lb 12.8 oz)  - SpO2 97%  - BMI 27.03 kg/m??      Exam  General: Middle-aged woman sitting comfortably in office chair.  EYES: Anicteric sclerae. Concentric irises with reactive pupils.   ENT: Auditory acuity intact to rubbed fingers.  Oropharynx is moist and without lesions.   NECK: Normal size and contour. Thyroid smooth and symmetric without focal nodules.   RESP: Relaxed respiratory effort. Clear to auscultation without wheezes or crackles.   CV: Regular rate and rhythm. Normal S1 and S2. No murmurs or gallops.  No lower extremity edema. Posterior tibial pulses are 2+ and symmetric.   GI: Normal abdominal bowel sounds. Soft, non-tender, and non-distended.   BREAST: Symmetric in size and contour.  No skin changes or nipple discharge.  No focal breast masses identified.  No axillary lymphadenopathy noted.  GU: not done today.  LYMPH: No enlarged cervical or supraclavicular nodes palpated.   MSK: Full upright posture, smooth and symmetric gait. All major joints show full range of motion without discomfort. Strength is 5/5 in the upper and lower extremities.   SKIN: No rashes or suspicious focal lesions noted.   NEURO: Patellar reflexes 2+ and symmetric.   PSYCHIATRIC: Alert and oriented. Speech fluent and sensible. No psychomotor agitation or slowing.             [facility] Components:     Barriers to goals identified and addressed. Pertinent handouts were given today and reviewed with the patient as indicated.  The Care Plan and Self-Management goals have been included on the AVS and the AVS has been printed. Any outside resources or referrals needed at this time are noted above. Patient's current medications have been reviewed. Any new medications prescribed have been discussed, and side effects have been addressed. Have assessed the patient's understanding, response, and barriers to adherence to medications. Patient voiced understanding and all questions have been answered to satisfaction.     Note - This chart has been prepared using the Dragon voice recognition system. Typographical errors may have occurred. ??Attempts have been made to correct errors, however, inadvertent errors may persist.

## 2017-09-03 NOTE — Unmapped (Signed)
Told by oncology to hold off on colonoscopy because of low wBC

## 2017-09-03 NOTE — Unmapped (Signed)
Blood pressure is at goal below 130/80  Medications: Continue current medications  Labs: no labs needed today  Doing very well on lower dose of lisinopril.  BMP done 1 week ago is wnl

## 2017-09-07 ENCOUNTER — Ambulatory Visit
Admission: RE | Admit: 2017-09-07 | Discharge: 2017-09-07 | Disposition: A | Discharge status: Home / Self Care | Payer: BC Managed Care – PPO

## 2017-09-07 ENCOUNTER — Ambulatory Visit: Admit: 2017-09-07 | Discharge: 1898-09-21 | Payer: BC Managed Care – PPO

## 2017-09-07 DIAGNOSIS — C50811 Malignant neoplasm of overlapping sites of right female breast: Principal | ICD-10-CM

## 2017-09-07 LAB — MEAN CORPUSCULAR HEMOGLOBIN: Lab: 32.9

## 2017-09-07 LAB — CBC W/ AUTO DIFF
BASOPHILS ABSOLUTE COUNT: 0.1 10*9/L (ref 0.0–0.1)
EOSINOPHILS ABSOLUTE COUNT: 0 10*9/L (ref 0.0–0.4)
HEMATOCRIT: 32.2 % — ABNORMAL LOW (ref 36.0–46.0)
HEMOGLOBIN: 10.2 g/dL — ABNORMAL LOW (ref 13.5–16.0)
LARGE UNSTAINED CELLS: 4 % (ref 0–4)
LYMPHOCYTES ABSOLUTE COUNT: 1.4 10*9/L — ABNORMAL LOW (ref 1.5–5.0)
MEAN CORPUSCULAR HEMOGLOBIN CONC: 31.6 g/dL (ref 31.0–37.0)
MEAN CORPUSCULAR HEMOGLOBIN: 32.9 pg (ref 26.0–34.0)
MEAN CORPUSCULAR VOLUME: 104 fL — ABNORMAL HIGH (ref 80.0–100.0)
MONOCYTES ABSOLUTE COUNT: 0.2 10*9/L (ref 0.2–0.8)
NEUTROPHILS ABSOLUTE COUNT: 1.7 10*9/L — ABNORMAL LOW (ref 2.0–7.5)
RED BLOOD CELL COUNT: 3.09 10*12/L — ABNORMAL LOW (ref 4.00–5.20)
RED CELL DISTRIBUTION WIDTH: 18.9 % — ABNORMAL HIGH (ref 12.0–15.0)
WBC ADJUSTED: 3.6 10*9/L — ABNORMAL LOW (ref 4.5–11.0)

## 2017-09-07 LAB — COMPREHENSIVE METABOLIC PANEL
ALBUMIN: 4.3 g/dL (ref 3.5–5.0)
ALKALINE PHOSPHATASE: 120 U/L (ref 38–126)
ALT (SGPT): 36 U/L (ref 15–48)
ANION GAP: 7 mmol/L — ABNORMAL LOW (ref 9–15)
AST (SGOT): 54 U/L — ABNORMAL HIGH (ref 14–38)
BILIRUBIN TOTAL: 0.6 mg/dL (ref 0.0–1.2)
BLOOD UREA NITROGEN: 18 mg/dL (ref 7–21)
BUN / CREAT RATIO: 19
CALCIUM: 10.4 mg/dL — ABNORMAL HIGH (ref 8.5–10.2)
CHLORIDE: 108 mmol/L — ABNORMAL HIGH (ref 98–107)
CO2: 27 mmol/L (ref 22.0–30.0)
CREATININE: 0.96 mg/dL (ref 0.60–1.00)
EGFR MDRD AF AMER: >=60 mL/min/{1.73_m2} (ref >=60)
EGFR MDRD NON AF AMER: 59 mL/min/{1.73_m2} — ABNORMAL LOW (ref >=60)
POTASSIUM: 4.5 mmol/L (ref 3.5–5.0)
PROTEIN TOTAL: 8 g/dL (ref 6.5–8.3)
SODIUM: 142 mmol/L (ref 135–145)

## 2017-09-07 LAB — SMEAR REVIEW

## 2017-09-07 LAB — BILIRUBIN TOTAL: Bilirubin:MCnc:Pt:Ser/Plas:Qn:: 0.6

## 2017-09-07 NOTE — Unmapped (Signed)
Pt in for labs only.  Left chest port a cath accessed per protocol.  Labs drawn.  Port flushed and heparinized per protocol.  Port de accessed; needle intact.  Gauze and band aid applied to site.  Pt tolerated with no problems.  Pt left ambulatory with no concerns.  Maree Erie, rn.

## 2017-09-07 NOTE — Unmapped (Signed)
Left 2nd VM for Brenda Mccarty asking her to return my call.

## 2017-09-08 NOTE — Unmapped (Signed)
September 08, 2017     Ms. [facility]  [address] [place] [address]      Dear Ms. Stegner,    It was a pleasure meeting you in the [facility] on August 06, 2017. We are sending this letter as a summary of our discussion and your genetic test results.    GENETIC TESTING: Most cancer is sporadic, meaning it happens by chance. About 5-10% of breast cancer is hereditary, or due to strong inherited risk factors. Families with features suggestive of hereditary risk tend to have multiple family members diagnosed with the same or related cancers (such as breast and ovarian cancer), diagnoses in multiple generations, diagnoses before age 109, and people diagnosed with two primary (new) cancers.    Some of the factors outlined above are found in your personal and family history. Specifically, you were recently diagnosed with breast cancer at 61. You shared that your sister was diagnosed with breast cancer at 42, and your niece through this sister was diagnosed with breast cancer at 59. Another sister was diagnosed with breast cancer at ~59. For full details of your reported family history, please see the enclosed copy of your family tree. Please contact us if there are any inaccuracies so that we may correct them.    Based on your personal and family history, we recommended you pursue testing of 11 genes associated with hereditary breast cancer, including BRCA1, BRCA2, and PALB2. For a full list of the genes that were analyzed, please see the enclosed copy of your test report.    Your test, which was performed at [facility], did not reveal a mutation in any of these genes. Since the current test is not perfect, it is possible there may be a mutation that current testing cannot detect, but that chance is small.      This normal result is generally reassuring and suggests that your breast cancer was most likely not due to an inherited predisposition. However, as we discussed, additional genetic testing in your family would allow [place] to interpret your result with more confidence.    CANCER SCREENING: We recommend you continue to follow the cancer screening guidelines outlined by your primary healthcare providers.       FAMILY MEMBERS: You shared that your sister and niece through your sister were diagnosed with breast cancer at young ages, and they had genetic testing about 10 years ago. As we discussed, they are likely candidates for updated genetic testing. Their test results could have very important implications for them, as well as for you and the rest of the family. Please let us know if we can help facilitate updated genetic testing for your sister or niece. To locate genetic counselors in other cities, visit the website of the [employer] ([email or web address]) and Financial controller for a Veterinary surgeon by zip code. We would also be happy to help your relatives find a genetic counselor nearby. Please contact us when their updated genetic test results are available so that we may continue to guide your care.      Again, it has been a pleasure working with you. Cancer genetics is a rapidly advancing field, and it is possible that new genetic tests could help guide your care in the future. We encourage you to remain in contact with [place] on an annual basis so we can update your personal and family histories and let you know of advances in cancer genetics that may benefit you and  your family. If we can be of further assistance, please call us at [phone].    Sincerely,    Alric Ran, MS, CGC, Genetic Counselor    Vernell Morgans, MD, PhD, Medical Geneticist       Enclosures: Genetic test results, family tree

## 2017-09-08 NOTE — Unmapped (Signed)
Spoke with Ms. Nosbisch about hereditary breast cancer genetic test results. Per [facility], she does not carry a clearly pathogenic variant in any of 11 genes associated with hereditary breast cancer, including BRCA1, BRCA2, and PALB2. We discussed that this normal test result is generally reassuring for Ms. Haegele and suggests her breast cancer was sporadic. However, additional/updated genetic testing in her sister and her niece, who were both diagnosed with early-onset breast cancer, would allow [place] to interpret her own results with more confidence.    Ms. Schriever shared that she spoke with her sister and niece after our appointment about genetic testing. Her sister is still working on obtaining a copy of her genetic test results, but her niece had BRCA1/2 testing only, by report. We discussed that additional genetic testing would be indicated for her niece, and we would be happy to help facilitate this. I encouraged Ms. Knudsen to provide [place] with a copy of updated results when available. Ms. Nicol voiced understanding and stated she will speak with her niece.    We will send a summary letter, copy of test results, and copy of family tree. We remain available.

## 2017-09-22 NOTE — Unmapped (Signed)
PATIENT SAID SHE HAS EXTRA BOTTLE DUE TO UPS DELIVERING MEDS TWICE.

## 2017-09-24 ENCOUNTER — Encounter: Admit: 2017-09-24 | Discharge: 2017-09-25 | Payer: PRIVATE HEALTH INSURANCE

## 2017-09-24 ENCOUNTER — Encounter
Admit: 2017-09-24 | Discharge: 2017-09-25 | Payer: PRIVATE HEALTH INSURANCE | Attending: Medical Oncology | Primary: Medical Oncology

## 2017-09-24 DIAGNOSIS — C50919 Malignant neoplasm of unspecified site of unspecified female breast: Principal | ICD-10-CM

## 2017-09-24 LAB — SMEAR REVIEW

## 2017-09-24 LAB — CBC W/ AUTO DIFF
BASOPHILS ABSOLUTE COUNT: 0.1 10*9/L (ref 0.0–0.1)
EOSINOPHILS ABSOLUTE COUNT: 0.1 10*9/L (ref 0.0–0.4)
HEMATOCRIT: 31.2 % — ABNORMAL LOW (ref 36.0–46.0)
HEMOGLOBIN: 10.5 g/dL — ABNORMAL LOW (ref 12.0–16.0)
LARGE UNSTAINED CELLS: 4 % (ref 0–4)
LYMPHOCYTES ABSOLUTE COUNT: 1 10*9/L — ABNORMAL LOW (ref 1.5–5.0)
MEAN CORPUSCULAR HEMOGLOBIN CONC: 33.5 g/dL (ref 31.0–37.0)
MEAN PLATELET VOLUME: 8 fL (ref 7.0–10.0)
MONOCYTES ABSOLUTE COUNT: 0.1 10*9/L — ABNORMAL LOW (ref 0.2–0.8)
NEUTROPHILS ABSOLUTE COUNT: 1.4 10*9/L — ABNORMAL LOW (ref 2.0–7.5)
PLATELET COUNT: 200 10*9/L (ref 150–440)
RED BLOOD CELL COUNT: 3.02 10*12/L — ABNORMAL LOW (ref 4.00–5.20)
WBC ADJUSTED: 2.7 10*9/L — ABNORMAL LOW (ref 4.5–11.0)

## 2017-09-24 LAB — COMPREHENSIVE METABOLIC PANEL
ALBUMIN: 4.8 g/dL (ref 3.5–5.0)
ALKALINE PHOSPHATASE: 176 U/L — ABNORMAL HIGH (ref 38–126)
ALT (SGPT): 50 U/L — ABNORMAL HIGH (ref 15–48)
ANION GAP: 11 mmol/L (ref 9–15)
AST (SGOT): 63 U/L — ABNORMAL HIGH (ref 14–38)
BILIRUBIN TOTAL: 0.9 mg/dL (ref 0.0–1.2)
BLOOD UREA NITROGEN: 14 mg/dL (ref 7–21)
BUN / CREAT RATIO: 16
CALCIUM: 10.3 mg/dL — ABNORMAL HIGH (ref 8.5–10.2)
CO2: 26 mmol/L (ref 22.0–30.0)
CREATININE: 0.89 mg/dL (ref 0.60–1.00)
EGFR MDRD AF AMER: >=60 mL/min/{1.73_m2} (ref >=60)
EGFR MDRD NON AF AMER: >=60 mL/min/{1.73_m2} (ref >=60)
GLUCOSE RANDOM: 88 mg/dL (ref 65–179)
POTASSIUM: 4.2 mmol/L (ref 3.5–5.0)
SODIUM: 145 mmol/L (ref 135–145)

## 2017-09-24 LAB — HEMATOCRIT: Lab: 31.2 — ABNORMAL LOW

## 2017-09-24 LAB — BLOOD UREA NITROGEN: Urea nitrogen:MCnc:Pt:Ser/Plas:Qn:: 14

## 2017-09-24 NOTE — Unmapped (Signed)
Labs drawn and sent for analysis.

## 2017-09-27 NOTE — Unmapped (Signed)
Interval Visit Note    Patient Name: Brenda Mccarty  Patient Age: 62 y.o.  Encounter Date: 09/24/2017    [id number] Brenda Pleasure, MD  REFERRING MD:  Dr Lucretia Roers    HPI:  The patient is a 62 year old woman who presented with a large clinical T3 pathologic N1 right breast cancer which was ER positive PR negative HER-2 negative. Scan from 07/22/17 showed bone scan was diffusely positive and she has apparent pulmonary and liver metastases.  Recently diagnosed with metastatic disease to the bone, liver, lung.  BX proven metastatic disease  Started her letrozole and Ibrance November 9.    INTERVAL:  Here with her husband today.  She is feeling better.  She is still working full-time but has adjusted her job so that she is not on her feet quite as much.  She notes mild constipation mild fatigue mild hair thinning mild hot flashes mild insomnia mild headache    ROS: She has filled out the [facility] [id number] form which I have reviewed with her and filed in med rec . Comprehensive 12 point ROS is performed and is negative except as noted above.       ONCOLOGY HX:    Oncology History    76 postmenopausal yF  With presenting stage 4 breast cancer 07/2017  ER+ PR- HER2-         Malignant neoplasm of overlapping sites of right female breast (CMS-HCC)    05/2017 -  Presenting Symptoms     End of Sept pt noticed b/l breast swelling, 1 week later left breast swelling resolved but right breast swelling worsened.   Last that week noticed non-erythematous-painful skin dimpling to the lateral right breast. She also palpated an intermittent mass in the right breast with baseline inverted nipples that seemed more pulled in on the right. Two weeks later presented to Urgent Care, prompting MMG/US.            06/24/2017 Interval Scan(s)     B/L Dx MMG: increased right breast density and ill-defined mass @9 :00 with associated posterior architectural distortion. Benign calcs B/L.   Left breast clear  [place]: Right breast 2.5 cm irregular mass at 9:00 middle depth.          06/29/2017 Biopsy     Right breast Bx: 9:00 7CFN, IDC-G2 with DCIS-G3, LVI  Right axillary LN Bx: IDC, G3         06/30/2017 -  Other     New pt consult-Duke Fuller [place], MD.   PE showed Rt enlarged-retracted-elevated breast, large irregularly shaped mobile mass along entire lateral aspect of breast from 7-1:00, nipple inversion Rt>Lt, skin dimpling inferior nipple, +Rt axillary LN.  RECS: B/L MRI breast and genetics referral.          07/12/2017 -  Other     [place] slide review:  A. Breast, right, site #1, 9:00, 7 cm from nipple, ultrasound-guided needle core biopsy  - Invasive ductal carcinoma of the breast (see diagnosis comment)  - Nottingham combined histologic grade: 2  - Tubule formation score: 3  - Nuclear pleomorphism score: 2  - Mitotic rate score: 1  - Largest contiguous size: 1.2 cm  - Lymphovascular invasion: Not identified  - Microcalcifications: Present in invasive carcinoma  - In situ carcinoma: Present; ductal carcinoma in situ, solid and comedo types  - Per outside report, ancillary studies:  - Estrogen receptor: Positive (94%, 3+)  - Progesterone receptor: Negative (<1%, 1+)  - HER2 (by IHC): Negative (1+)  ??  B. 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          07/23/2017 -  Other     Started monthly Zometa         07/27/2017 Biopsy     Liver core biopsy-involved by carcinoma C/W breast metastatic carcinoma.  ER 95% PR negative HER-2 negative.         07/30/2017 -  Other     Started Letrozole and Ibrance.          08/04/2017 Genetics     STRATA:  ??? CDKN2A deep deletion  Estimated copy number: 0, confidence interval: 0.4 - 0.6  ??? FGFR1 amplification  See Medical Director Note  ??? PTEN deep deletion  See Medical Director Note  ??? TP53 p.V172F [id number]>T  Estimated variant allele frequency: 46%         08/06/2017 Genetics     [employer] germline testing: Negative          Review of Systems:  She has filled out the [facility] [id number] form which I have reviewed with her and filed in med rec . Comprehensive 12 point ROS is performed and is negative except as noted above.      Meds:  Current Outpatient Prescriptions:   ???  acetaminophen (TYLENOL) 500 MG tablet, Take 500 mg by mouth., Disp: , Rfl:   ???  letrozole (FEMARA) 2.5 mg tablet, Take 1 tablet (2.5 mg total) by mouth daily., Disp: 30 tablet, Rfl: 11  ???  lisinopril (PRINIVIL,ZESTRIL) 5 MG tablet, Take 1 tablet (5 mg total) by mouth daily., Disp: 90 tablet, Rfl: 3  ???  MULTI-VITAMIN WITH IRON ORAL, Take 1 tablet by mouth daily., Disp: , Rfl:   ???  palbociclib (IBRANCE) 125 mg capsule, Take 1 capsule (125 mg total) by mouth daily. For 21 days and then 7 days off, Disp: 21 capsule, Rfl: 0  ???  calcium carbonate (CALCIUM 500) 500 mg calcium (1,250 mg) chewable tablet, Chew 1 tablet daily., Disp: , Rfl:   ???  traMADol (ULTRAM) 50 mg tablet, Take 1 tablet (50 mg total) by mouth every six (6) hours as needed for pain. (Patient not taking: Reported on 09/24/2017), Disp: 30 tablet, Rfl: 0    ALLERGIES: No Known Allergies  PAST MEDICAL HX:  Past Medical History:   Diagnosis Date   ??? Hypertension      SOCIAL HX:  Here today with her husband . She has no children  She works in Engineering geologist    FAMHX:  2 sisters with breast cancer in their 69's   Genetic testing negative    Physical Exam:  Vital Signs for this encounter:  Vitals:    09/24/17 1159   BP: 132/69   Pulse: 63   Resp: 16   Temp: 36.6 ??C (97.8 ??F)   SpO2: 100%     BSA: 1.69 meters squared  General:  Normal appearing female in no acute distress..  Cardiovascular:  Heart not clinically enlarged.  No significant murmurs or gallops.  No lower extremity edema.    Respiratory:  Chest clear to percussion and auscultation.   Gastrointestinal:  Abdomen soft without masses and tenderness, no hepatosplenomegaly or other masses.   Musculoskeletal:  No  bony pain or tenderness.   Skin/Subcutaneous Tissues:  Negative. No rash, ecchymoses, purpuric lesions noted.  Psychiatric:  Alert and oriented.  Mood is normal.  No other symptoms.   Upper Extremity Lymphedema: None  Breast and regional nodes: large fixed right breast mass at least 10x 11 cm-mass feels softer on exam. No cervical/axillary/supraclavicular lymphadenopathy appreciated-(previously Rt axillary +LN).     Orders/Results:    Lab on 09/24/2017   Component Date Value   ??? Sodium 09/24/2017 145    ??? Potassium 09/24/2017 4.2    ??? Chloride 09/24/2017 108*   ??? CO2 09/24/2017 26.0    ??? BUN 09/24/2017 14    ??? Creatinine 09/24/2017 0.89    ??? BUN/Creatinine Ratio 09/24/2017 16    ??? EGFR MDRD Non Af Amer 09/24/2017 >=60    ??? EGFR MDRD Af Amer 09/24/2017 >=60    ??? Anion Gap 09/24/2017 11    ??? Glucose 09/24/2017 88    ??? Calcium 09/24/2017 10.3*   ??? Albumin 09/24/2017 4.8    ??? Total Protein 09/24/2017 8.5*   ??? Total Bilirubin 09/24/2017 0.9    ??? AST 09/24/2017 63*   ??? ALT 09/24/2017 50*   ??? Alkaline Phosphatase 09/24/2017 176*   ??? WBC 09/24/2017 2.7*   ??? RBC 09/24/2017 3.02*   ??? HGB 09/24/2017 10.5*   ??? HCT 09/24/2017 31.2*   ??? MCV 09/24/2017 103.6*   ??? [facility] 09/24/2017 34.7*   ??? MCHC 09/24/2017 33.5    ??? RDW 09/24/2017 18.5*   ??? MPV 09/24/2017 8.0    ??? Platelet 09/24/2017 200    ??? Absolute Neutrophils 09/24/2017 1.4*   ??? Absolute Lymphocytes 09/24/2017 1.0*   ??? Absolute Monocytes 09/24/2017 0.1*   ??? Absolute Eosinophils 09/24/2017 0.1    ??? Absolute Basophils 09/24/2017 0.1    ??? Large Unstained Cells 09/24/2017 4    ??? Macrocytosis 09/24/2017 Marked*   ??? Anisocytosis 09/24/2017 Moderate*   ??? Smear Review Comments 09/24/2017 See Comment*       Assessment/Plan:  1. Metastatic breast cancer (CMS-HCC)-HR+ HER2 negative, which was biopsy proven in the liver on 07/27/17. Per imaging, bone and lung involvement also present. She was is started on Zometa, Ibrance and Letrozole Nov 2018, which she is tolerating with mild fatigue and arthralgias.         Systemic Therapy:  Continue Ibrance and letrozole  Continue Zometa monthly, tolerating well          Genetics:  Germline testing negative  STRATA, possible NCI match phase 1 clinical trial targeted for FGFR-1, messages sent to inquire for candidacy/enrollment.--- prob more approp after first line therapy exhausted    Follow-up:  1 month

## 2017-10-18 MED ORDER — PALBOCICLIB 125 MG CAPSULE: 125 mg | capsule | Freq: Every day | 6 refills | 0 days | Status: AC

## 2017-10-18 MED ORDER — PALBOCICLIB 125 MG CAPSULE
Freq: Every day | ORAL | 6 refills | 0.00000 days | Status: CP
Start: 2017-10-18 — End: 2017-10-18

## 2017-10-18 NOTE — Unmapped (Signed)
[facility] Refill Coordination Note    Specialty Program and Medication(s) to be Shipped:   Breast Oncology: IBRANCE 125MG  *  Other medications to be shipped: 0     Brenda Mccarty, DOB: [birth year]  Phone: [phone] (home)   Shipping Address: [address] [place] [address]  All above HIPAA information was verified with patient.     Completed refill call assessment today to schedule patient's medication shipment from the [facility] [phone]).       Medications reviewed and verified:      Specialty medication(s) and dose(s) confirmed: yes  Changes to medications: no  Changes to insurance: no  Tolerating medications:   Adverse Effects    Nausea:  Neg          DISEASE-SPECIFIC INFORMATION        N/A    ADHERENCE     Medication Adherence    Patient Reported X Missed Doses in the Last Month:  0  Specialty Medication:  IBRANCE 125MG  QOH A FEW DAYS PLUS WEEK OFF   Patient is on additional specialty medications:  No  Informant:  patient  Confirmed Plan for Next Specialty Medication Refill:  delivery by pharmacy  Refills Needed for Supportive Medications:  yes, ordered or provider notified  Medication Assistance Program  Refill Coordination  Has the Patients' Contact Information Changed:  No    Is the Shipping Address Different:  No    Shipping Information  Delivery Scheduled:  Yes  Delivery Date:  10/22/17  Medications to be Shipped:  IBRANCE 125MG        (change to the new smartlinks)    (Below is required for Medicare Part B billed medications only - per drug):   Quantity dispensed last month: 21  Remaining supply on hand: 10+  day(s).      SHIPPING     Delivery Scheduled: yes, Expected medication delivery date: 10/22/17    Patient is experiencing more nausea.      Jolene Schimke  [facility]

## 2017-10-21 MED FILL — IBRANCE/125MG/CAPS: IBRANCE/125MG/CAPS | 28 days supply | Qty: 21 | Fill #0

## 2017-10-22 ENCOUNTER — Encounter: Admit: 2017-10-22 | Discharge: 2017-10-22 | Payer: PRIVATE HEALTH INSURANCE

## 2017-10-22 ENCOUNTER — Encounter: Admit: 2017-10-22 | Discharge: 2017-10-22 | Payer: PRIVATE HEALTH INSURANCE | Attending: Family | Primary: Family

## 2017-10-22 DIAGNOSIS — Z170 Estrogen receptor positive status [ER+]: Secondary | ICD-10-CM

## 2017-10-22 DIAGNOSIS — C50919 Malignant neoplasm of unspecified site of unspecified female breast: Principal | ICD-10-CM

## 2017-10-22 DIAGNOSIS — C50811 Malignant neoplasm of overlapping sites of right female breast: Secondary | ICD-10-CM

## 2017-10-22 LAB — CBC W/ AUTO DIFF
BASOPHILS ABSOLUTE COUNT: 0 10*9/L (ref 0.0–0.1)
EOSINOPHILS ABSOLUTE COUNT: 0.1 10*9/L (ref 0.0–0.4)
HEMATOCRIT: 27.7 % — ABNORMAL LOW (ref 36.0–46.0)
HEMOGLOBIN: 9.3 g/dL — ABNORMAL LOW (ref 12.0–16.0)
LARGE UNSTAINED CELLS: 4 % (ref 0–4)
LYMPHOCYTES ABSOLUTE COUNT: 1.1 10*9/L — ABNORMAL LOW (ref 1.5–5.0)
MEAN CORPUSCULAR HEMOGLOBIN CONC: 33.6 g/dL (ref 31.0–37.0)
MEAN CORPUSCULAR VOLUME: 107.4 fL — ABNORMAL HIGH (ref 80.0–100.0)
MEAN PLATELET VOLUME: 7.9 fL (ref 7.0–10.0)
MONOCYTES ABSOLUTE COUNT: 0.1 10*9/L — ABNORMAL LOW (ref 0.2–0.8)
NEUTROPHILS ABSOLUTE COUNT: 2 10*9/L (ref 2.0–7.5)
PLATELET COUNT: 251 10*9/L (ref 150–440)
RED BLOOD CELL COUNT: 2.58 10*12/L — ABNORMAL LOW (ref 4.00–5.20)
RED CELL DISTRIBUTION WIDTH: 18 % — ABNORMAL HIGH (ref 12.0–15.0)
WBC ADJUSTED: 3.5 10*9/L — ABNORMAL LOW (ref 4.5–11.0)

## 2017-10-22 LAB — COMPREHENSIVE METABOLIC PANEL
ALBUMIN: 4.1 g/dL (ref 3.5–5.0)
ALKALINE PHOSPHATASE: 232 U/L — ABNORMAL HIGH (ref 38–126)
ANION GAP: 6 mmol/L — ABNORMAL LOW (ref 9–15)
AST (SGOT): 128 U/L — ABNORMAL HIGH (ref 14–38)
BILIRUBIN TOTAL: 1.1 mg/dL (ref 0.0–1.2)
BLOOD UREA NITROGEN: 14 mg/dL (ref 7–21)
BUN / CREAT RATIO: 15
CALCIUM: 10.1 mg/dL (ref 8.5–10.2)
CHLORIDE: 107 mmol/L (ref 98–107)
CO2: 28 mmol/L (ref 22.0–30.0)
CREATININE: 0.95 mg/dL (ref 0.60–1.00)
EGFR MDRD AF AMER: >=60 mL/min/{1.73_m2} (ref >=60)
EGFR MDRD NON AF AMER: >=60 mL/min/{1.73_m2} (ref >=60)
GLUCOSE RANDOM: 92 mg/dL (ref 65–179)
PROTEIN TOTAL: 7.6 g/dL (ref 6.5–8.3)
SODIUM: 141 mmol/L (ref 135–145)

## 2017-10-22 LAB — EGFR MDRD AF AMER: Glomerular filtration rate/1.73 sq M.predicted.black:ArVRat:Pt:Ser/Plas/Bld:Qn:Creatinine-based formula (MDRD): >=60

## 2017-10-22 LAB — SMEAR REVIEW

## 2017-10-22 LAB — CREATININE
EGFR MDRD AF AMER: >=60 mL/min/{1.73_m2} (ref >=60)
EGFR MDRD NON AF AMER: >=60 mL/min/{1.73_m2} (ref >=60)

## 2017-10-22 LAB — RED BLOOD CELL COUNT: Lab: 2.58 — ABNORMAL LOW

## 2017-10-22 LAB — CALCIUM: Calcium:MCnc:Pt:Ser/Plas:Qn:: 10.1

## 2017-10-22 LAB — ALKALINE PHOSPHATASE: Alkaline phosphatase:CCnc:Pt:Ser/Plas:Qn:: 232 — ABNORMAL HIGH

## 2017-10-22 MED ORDER — IBRANCE 125 MG CAPSULE
ORAL_CAPSULE | PRN refills | 0 days | Status: CP
Start: 2017-10-22 — End: 2017-11-19

## 2017-10-22 MED ORDER — PALBOCICLIB 125 MG CAPSULE
99 refills | 0 days
Start: 2017-10-22 — End: 2017-10-22

## 2017-10-22 NOTE — Unmapped (Signed)
Labs drawn and sent for analysis.  Care provided by  Preston Fleeting, RN

## 2017-10-22 NOTE — Unmapped (Unsigned)
Pt presents in NAD, no complaints, pain free, no negative changes since last visit. Port accessed prior to arrival to infusion center.+blood return.    1411 Port de-accessed after 500 units of heparin instilled pt tolerated infusion without difficulty. NAD, no questions, no complaints at time of discharge, verbalized understanding of d/c instructions. Pt left infusion center ambulatory, steady gait.

## 2017-10-22 NOTE — Unmapped (Signed)
Refill request

## 2017-10-22 NOTE — Unmapped (Signed)
Interval Visit Note    Patient Name: Brenda Mccarty  Patient Age: 62 y.o.  Encounter Date: 10/22/2017    [id number] [name], MD  REFERRING MD:  Dr Lucretia Roers    HPI:  The patient is a 62 year old woman who presented with a large clinical T3 pathologic N1 right breast cancer which was ER positive PR negative HER-2 negative. Scan from 07/22/17 showed bone scan was diffusely positive and she has apparent pulmonary and liver metastases.  Recently diagnosed with metastatic disease to the bone, liver, lung.  BX proven metastatic disease  Started her letrozole and Ibrance November 9.    INTERVAL:  Here with her husband today.    She is feeling better overall.    Her hip pain has improved since starting CDK4/6 and AI,   However still with R>L hip pain intermittent that responds to Tylenol 500mg  prn.   The pain radiates to b/l mid-thighs.  No sob. Mild cough without fever reported.    No bladder or bowel dysfunction.   No saddle numbness/tingling.   Continues to work Full time-2 jobs and doesn't always get 8 hours of sleep.  Still notes some mild constipation mild fatigue mild hair thinning mild hot flashes mild insomnia mild headache  Performance status=1    ROS: She has filled out the [facility] [id number] form which I have reviewed with her and filed in med rec . Comprehensive 12 point ROS is performed and is negative except as noted above.       ONCOLOGY HX:    Oncology History    46 postmenopausal yF  With presenting stage 4 breast cancer 07/2017  ER+ PR- HER2-         Malignant neoplasm of overlapping sites of right female breast (CMS-HCC)    05/2017 -  Presenting Symptoms     End of Sept pt noticed b/l breast swelling, 1 week later left breast swelling resolved but right breast swelling worsened.   Last that week noticed non-erythematous-painful skin dimpling to the lateral right breast. She also palpated an intermittent mass in the right breast with baseline inverted nipples that seemed more pulled in on the right. Two weeks later presented to Urgent Care, prompting MMG/US.            06/24/2017 Interval Scan(s)     B/L Dx MMG: increased right breast density and ill-defined mass @9 :00 with associated posterior architectural distortion. Benign calcs B/L.   Left breast clear  [place]: Right breast 2.5 cm irregular mass at 9:00 middle depth.          06/29/2017 Biopsy     Right breast Bx: 9:00 7CFN, IDC-G2 with DCIS-G3, LVI  Right axillary LN Bx: IDC, G3         06/30/2017 -  Other     New pt consult-Duke Fuller [place], MD.   PE showed Rt enlarged-retracted-elevated breast, large irregularly shaped mobile mass along entire lateral aspect of breast from 7-1:00, nipple inversion Rt>Lt, skin dimpling inferior nipple, +Rt axillary LN.  RECS: B/L MRI breast and genetics referral.          07/12/2017 -  Other     [place] slide review:  A. Breast, right, site #1, 9:00, 7 cm from nipple, ultrasound-guided needle core biopsy  - Invasive ductal carcinoma of the breast (see diagnosis comment)  - Nottingham combined histologic grade: 2  - Tubule formation score: 3  - Nuclear pleomorphism score: 2  - Mitotic rate score: 1  - Largest contiguous  size: 1.2 cm  - Lymphovascular invasion: Not identified  - Microcalcifications: Present in invasive carcinoma  - In situ carcinoma: Present; ductal carcinoma in situ, solid and comedo types  - Per outside report, ancillary studies:  - Estrogen receptor: Positive (94%, 3+)  - Progesterone receptor: Negative (<1%, 1+)  - HER2 (by IHC): Negative (1+)  ??  B. 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          07/23/2017 -  Other     Started monthly Zometa         07/27/2017 Biopsy     Liver core biopsy-involved by carcinoma C/W breast metastatic carcinoma.  ER 95% PR negative HER-2 negative. 07/30/2017 -  Other     Started Letrozole and Ibrance.          08/04/2017 Genetics     STRATA:  ??? CDKN2A deep deletion  Estimated copy number: 0, confidence interval: 0.4 - 0.6  ??? FGFR1 amplification  See Medical Director Note  ??? PTEN deep deletion  See Medical Director Note  ??? TP53 p.V172F  [id number].514G>T  Estimated variant allele frequency: 46%         08/06/2017 Genetics     [employer] germline testing: Negative          Review of Systems:  She has filled out the [facility] [id number] form which I have reviewed with her and filed in med rec . Comprehensive 12 point ROS is performed and is negative except as noted above.      Meds:  Current Outpatient Prescriptions:   ???  acetaminophen (TYLENOL) 500 MG tablet, Take 500 mg by mouth., Disp: , Rfl:   ???  calcium carbonate (CALCIUM 500) 500 mg calcium (1,250 mg) chewable tablet, Chew 1 tablet daily., Disp: , Rfl:   ???  letrozole (FEMARA) 2.5 mg tablet, Take 1 tablet (2.5 mg total) by mouth daily., Disp: 30 tablet, Rfl: 11  ???  lisinopril (PRINIVIL,ZESTRIL) 5 MG tablet, Take 1 tablet (5 mg total) by mouth daily., Disp: 90 tablet, Rfl: 3  ???  MULTI-VITAMIN WITH IRON ORAL, Take 1 tablet by mouth daily., Disp: , Rfl:   ???  palbociclib (IBRANCE) 125 mg capsule, Take 1 capsule (125 mg total) by mouth daily. For 21 days and then 7 days off, Disp: 21 capsule, Rfl: 6  ???  traMADol (ULTRAM) 50 mg tablet, Take 1 tablet (50 mg total) by mouth every six (6) hours as needed for pain., Disp: 30 tablet, Rfl: 0    ALLERGIES: No Known Allergies  PAST MEDICAL HX:  Past Medical History:   Diagnosis Date   ??? Breast cancer (CMS-HCC) 06/2017    T3 pathologic N1 right breast cancer which was ER positive PR negative HER-2 negative; mets to bone, liver and lung   ??? Hypertension      SOCIAL HX:  Here today with her husband . She has no children  She works in Engineering geologist    FAMHX:  2 sisters with breast cancer in their 40's   Genetic testing negative    Physical Exam:  Vital Signs for this encounter: Vitals:    10/22/17 1107   BP: 141/72   Pulse: 60   Resp: 18  Temp: 36 ??C (96.8 ??F)   SpO2: 100%     BSA: 1.69 meters squared  General:  Normal appearing female in no acute distress..  Cardiovascular:  Heart not clinically enlarged.  No significant murmurs or gallops.  No lower extremity edema.    Respiratory:  Chest clear to percussion and auscultation.   Gastrointestinal:  Abdomen soft without masses and tenderness, no hepatosplenomegaly or other masses.   Musculoskeletal:  No bony pain or tenderness.   Skin/Subcutaneous Tissues:  Negative. No rash, ecchymoses, purpuric lesions noted.  Psychiatric:  Alert and oriented.  Mood is normal.  No other symptoms.   Upper Extremity Lymphedema: None  Breast and regional nodes: large fixed right breast mass at least 10x 10 cm-mass feels softer on exam. No cervical/axillary/supraclavicular lymphadenopathy appreciated-(previously Rt axillary +LN).     Orders/Results:    Lab on 10/22/2017   Component Date Value   ??? WBC 10/22/2017 3.5*   ??? RBC 10/22/2017 2.58*   ??? HGB 10/22/2017 9.3*   ??? HCT 10/22/2017 27.7*   ??? MCV 10/22/2017 107.4*   ??? [facility] 10/22/2017 36.1*   ??? MCHC 10/22/2017 33.6    ??? RDW 10/22/2017 18.0*   ??? MPV 10/22/2017 7.9    ??? Platelet 10/22/2017 251    ??? Absolute Neutrophils 10/22/2017 2.0    ??? Absolute Lymphocytes 10/22/2017 1.1*   ??? Absolute Monocytes 10/22/2017 0.1*   ??? Absolute Eosinophils 10/22/2017 0.1    ??? Absolute Basophils 10/22/2017 0.0    ??? Large Unstained Cells 10/22/2017 4    ??? Macrocytosis 10/22/2017 Marked*   ??? Anisocytosis 10/22/2017 Moderate*       Assessment/Plan:  1. Metastatic breast cancer (CMS-HCC)-HR+ HER2 negative, which was biopsy proven in the liver on 07/27/17. Per imaging, bone and lung involvement also present. She was is started on Zometa, Ibrance and Letrozole Nov 2018, which she is tolerating with mild fatigue and arthralgias.     I spent at least 40 minutes with this patient more than 50% in counseling. The following issues were discussed:     Systemic Therapy:  Continue Ibrance and letrozole, labs adequate  Continue Zometa monthly, labs adquate, tolerating well  Right breast mass is clinically responding, along with MSK bony mets  Right hip improved since started MBC Tx, relieved with Tylenol prn  LFTs on the raise today, asymptomatic with normal PE, continue to monitor  Send for staging scans    Genetics:  Germline testing negative  STRATA, possible NCI match phase 1 clinical trial targeted for FGFR-1, messages sent to inquire for candidacy/enrollment.--- prob more approp after first line therapy exhausted    Follow-up:  1 month [place]-staging visit/labs/Zometa

## 2017-10-22 NOTE — Unmapped (Signed)
It was a pleasure seeing you today.   Continue with the current plan of care.  Follow-up in 4 weeks.    Orders Placed This Encounter   Procedures   ??? NM Bone Scan Whole Body   ??? CT Chest W Contrast   ??? CT Abdomen Pelvis W Contrast   ??? CBC w/ Differential   ??? Comprehensive Metabolic Panel       Labs from today if done:  Lab on 10/22/2017   Component Date Value   ??? Creatinine 10/22/2017 0.95    ??? EGFR MDRD Af Amer 10/22/2017 >=60    ??? EGFR MDRD Non Af Amer 10/22/2017 >=60    ??? Calcium 10/22/2017 10.1    ??? Sodium 10/22/2017 141    ??? Potassium 10/22/2017 4.1    ??? Chloride 10/22/2017 107    ??? CO2 10/22/2017 28.0    ??? BUN 10/22/2017 14    ??? Creatinine 10/22/2017 0.95    ??? BUN/Creatinine Ratio 10/22/2017 15    ??? EGFR MDRD Non Af Amer 10/22/2017 >=60    ??? EGFR MDRD Af Amer 10/22/2017 >=60    ??? Anion Gap 10/22/2017 6*   ??? Glucose 10/22/2017 92    ??? Calcium 10/22/2017 10.1    ??? Albumin 10/22/2017 4.1    ??? Total Protein 10/22/2017 7.6    ??? Total Bilirubin 10/22/2017 1.1    ??? AST 10/22/2017 128*   ??? ALT 10/22/2017 90*   ??? Alkaline Phosphatase 10/22/2017 232*   ??? WBC 10/22/2017 3.5*   ??? RBC 10/22/2017 2.58*   ??? HGB 10/22/2017 9.3*   ??? HCT 10/22/2017 27.7*   ??? MCV 10/22/2017 107.4*   ??? [facility] 10/22/2017 36.1*   ??? MCHC 10/22/2017 33.6    ??? RDW 10/22/2017 18.0*   ??? MPV 10/22/2017 7.9    ??? Platelet 10/22/2017 251    ??? Absolute Neutrophils 10/22/2017 2.0    ??? Absolute Lymphocytes 10/22/2017 1.1*   ??? Absolute Monocytes 10/22/2017 0.1*   ??? Absolute Eosinophils 10/22/2017 0.1    ??? Absolute Basophils 10/22/2017 0.0    ??? Large Unstained Cells 10/22/2017 4    ??? Macrocytosis 10/22/2017 Marked*   ??? Anisocytosis 10/22/2017 Moderate*        Future Appointments:  Future Appointments  Date Time Provider Department Center   10/25/2017 9:00 AM April Armstead [place], [facility]   09/05/2018 8:00 AM Sangeeta Van Clines, MD [facility] [place]       For appointments call [phone]  For new symptoms or health related issues, please call   Nurse Navigator: Dan Maker, RN [phone]  On weekends and after hours on weekdays, please call [phone] for the oncology fellow on call.      If you use myUNCchart, please know that I check messages only twice a week. If your message is urgent, please call or page your nurse navigator or the on-call fellow.

## 2017-10-22 NOTE — Unmapped (Unsigned)
Labs found to be within parameters for treatment today. Request for drug sent to pharmacy.

## 2017-10-22 NOTE — Unmapped (Deleted)
HISTORY OF PRESENT ILLNESS:   This 62 y.o. G1P0 is a [facility] patient who presents for a well-woman exam.    - R breast cancer dx'd 06/2017 with mets to bone, liver, and lung; started letrozole and Ibrance in 07/2018; followed at [facility]    GYN HISTORY:   LMP: No LMP recorded. Patient is postmenopausal.  Last Pap: {findings; last pap:13140}  History of abnormal Pap: {Pap Smear Hx:25575}  STI history: {Blank List:44354::negative,***}  Mammogram hx: 06/2017, see HPI  Colonoscopy hx: {BLANK SINGLE:19197::recall in *** years}  DEXA hx: {BLANK SINGLE:19197::normal,osteopenia,osteoporosis,repeat in *** years}      OB History   Gravida Para Term Preterm AB Living   1 0           SAB TAB Ectopic Molar Multiple Live Births                    # Outcome Date GA Lbr Len/2nd Weight Sex Delivery Anes PTL Lv   1 Gravida                   MEDS:   Current Outpatient Prescriptions   Medication Sig Dispense Refill   ??? acetaminophen (TYLENOL) 500 MG tablet Take 500 mg by mouth.     ??? calcium carbonate (CALCIUM 500) 500 mg calcium (1,250 mg) chewable tablet Chew 1 tablet daily.     ??? letrozole (FEMARA) 2.5 mg tablet Take 1 tablet (2.5 mg total) by mouth daily. 30 tablet 11   ??? lisinopril (PRINIVIL,ZESTRIL) 5 MG tablet Take 1 tablet (5 mg total) by mouth daily. 90 tablet 3   ??? MULTI-VITAMIN WITH IRON ORAL Take 1 tablet by mouth daily.     ??? palbociclib (IBRANCE) 125 mg capsule Take 1 capsule (125 mg total) by mouth daily. For 21 days and then 7 days off 21 capsule 6   ??? traMADol (ULTRAM) 50 mg tablet Take 1 tablet (50 mg total) by mouth every six (6) hours as needed for pain. (Patient not taking: Reported on 09/24/2017) 30 tablet 0     No current facility-administered medications for this visit.        ALLERGIES: No Known Allergies    PAST MEDICAL HISTORY:   Past Medical History:   Diagnosis Date   ??? Breast cancer (CMS-HCC) 06/2017    T3 pathologic N1 right breast cancer which was ER positive PR negative HER-2 negative; mets to bone, liver and lung   ??? Hypertension        PAST SURGICAL HISTORY:   Past Surgical History:   Procedure Laterality Date   ??? IR INSERT PORT AGE GREATER THAN 5 YRS  07/22/2017    IR INSERT PORT AGE GREATER THAN 5 YRS 07/22/2017 Maree Erie, MD IMG [facility]   ??? MYOMECTOMY         SOCIAL HISTORY:  Social History     Social History   ??? Marital status: Single     Spouse name: N/A   ??? Number of children: N/A   ??? Years of education: N/A     Occupational History   ??? Not on file.     Social History Main Topics   ??? Smoking status: Never Smoker   ??? Smokeless tobacco: Never Used   ??? Alcohol use No   ??? Drug use: No   ??? Sexual activity: Not on file     Other Topics Concern   ??? Not on file     Social History Narrative  Lives alone    Employment: retail        Caffeine: none    Exercise: sometimes    Seatbelts: regularly    Cell phone while driving: rarely    Sunscreen: sometimes    Dental care: regularly    Eye care: overdue       FAMILY HISTORY:   Family History   Problem Relation Age of Onset   ??? Heart attack Mother    ??? Pancreatic cancer Father    ??? Breast cancer Sister         Deceased of BCA at 57y/o   ??? Sarcoidosis Brother    ??? Breast cancer Sister         Alive, Dx BCA at 82 y/o-remission doing well   ??? Kidney cancer Sister    ??? Breast cancer Other         Niece Dx 32 y/o-remission doing well       REVIEW OF SYSTEMS: {ABS REVIEW OF SYSTEMS:23552}    PHYSICAL EXAM:  Vital signs:  There were no vitals taken for this visit.  General: well-appearing female in NAD  Skin:  intact, without rashes or lesions  Lymph:  no clavicular, axillary or inguinal lymphadenopathy  Neck:  thyroid smooth, non-enlarged, without nodules  Lungs:  CTA B/L  Cardiac:  RRR, no MRG  Breasts:  symmetrical, everted nipples, without discharge, without dominant masses  Abdomen:  soft, NT, ND, no organomegaly  Genitalia:  NEFG, BUS negative, vagina pink and rugaeted, cervix is without lesions  BME: uterus is normal size and non-tender, {exam; uterus:12215}, non-gravid, no CMT, no adnexal tenderness or palpable masses; rectal exam deferred  Extremities:  full ROM x 4 extremities, no edema  Psych: pleasant and interactive    POCT:  Results for orders placed or performed in visit on 09/24/17   Comprehensive Metabolic Panel   Result Value Ref Range    Sodium 145 135 - 145 mmol/L    Potassium 4.2 3.5 - 5.0 mmol/L    Chloride 108 (H) 98 - 107 mmol/L    CO2 26.0 22.0 - 30.0 mmol/L    BUN 14 7 - 21 mg/dL    Creatinine 5.62 1.30 - 1.00 mg/dL    BUN/Creatinine Ratio 16     EGFR MDRD Non Af Amer >=60 >=60 mL/min/1.69m2    EGFR MDRD Af Amer >=60 >=60 mL/min/1.69m2    Anion Gap 11 9 - 15 mmol/L    Glucose 88 65 - 179 mg/dL    Calcium 86.5 (H) 8.5 - 10.2 mg/dL    Albumin 4.8 3.5 - 5.0 g/dL    Total Protein 8.5 (H) 6.5 - 8.3 g/dL    Total Bilirubin 0.9 0.0 - 1.2 mg/dL    AST 63 (H) 14 - 38 U/L    ALT 50 (H) 15 - 48 U/L    Alkaline Phosphatase 176 (H) 38 - 126 U/L   CBC w/ Differential   Result Value Ref Range    WBC 2.7 (L) 4.5 - 11.0 10*9/L    RBC 3.02 (L) 4.00 - 5.20 10*12/L    HGB 10.5 (L) 12.0 - 16.0 g/dL    HCT 78.4 (L) 69.6 - 46.0 %    MCV 103.6 (H) 80.0 - 100.0 fL    MCH 34.7 (H) 26.0 - 34.0 pg    MCHC 33.5 31.0 - 37.0 g/dL    RDW 29.5 (H) 28.4 - 15.0 %    MPV 8.0 7.0 - 10.0 fL  Platelet 200 150 - 440 10*9/L    Absolute Neutrophils 1.4 (L) 2.0 - 7.5 10*9/L    Absolute Lymphocytes 1.0 (L) 1.5 - 5.0 10*9/L    Absolute Monocytes 0.1 (L) 0.2 - 0.8 10*9/L    Absolute Eosinophils 0.1 0.0 - 0.4 10*9/L    Absolute Basophils 0.1 0.0 - 0.1 10*9/L    Large Unstained Cells 4 0 - 4 %    Macrocytosis Marked (A) Not Present    Anisocytosis Moderate (A) Not Present   Morphology Review   Result Value Ref Range    Smear Review Comments See Comment (A) Undefined         ASSESSMENT & PLAN:  Problem List Items Addressed This Visit     None      Visit Diagnoses     Encounter for annual routine gynecological examination    -  Primary            - {BLANK SINGLE:19197::light,mod,heavy}  - {Desc; normal/abnormal:11317::Normal} exam  - Pap: {BLANK SINGLE:19197::up-to-date; next due ***,with HPV testing today,with reflex-HPV testing today}  - Mammogram: {BLANK SINGLE:19197::is up-to-date; next due ***,ordered}  - Colonoscopy: {BLANK SINGLE:19197::is up-to-date; next due ***,ordered}  - DEXA: {BLANK SINGLE:19197::is up-to-date; next due ***,ordered}  - Preventative blood labs: {BLANK SINGLE:19197::drawn,monitored by PCP}   - Contraception: {BLANK SINGLE:19197::happy with current method,Mirena IUD in place; device is due for removal in ***,Nexplanon in place; device is due for removal in ***,discussed options for contraception which included OCPs, contraceptive ring, contraceptive patch, Depo Provera, Nexplanon, and IUDs; reviewed associated risks/benefits/side effects of each}     Patient Education: {GYN Pt Ed:25594}  ***Recommend 30 minutes of moderate aerobic exercise on most days of the week, balanced diet with adequate intake of calcium and vitamin D q day, adequate sleep of 7-9 hours daily, establishing care or follow-up with PCP for comorbidities      No Follow-up on file.      Raymondo Band, MSN, [facility]

## 2017-10-22 NOTE — Unmapped (Signed)
If you feel like you're having an emergency please call 911.  For appointments or questions Monday through Friday 8AM-5PM please call [phone] or Toll Free [phone]. For Medical questions or concerns ask for the Nurse Triage Line.  On Nights, Weekends, and Holidays call [phone] and ask for the Oncologist on Call.  Reasons to call the Nurse Triage Line:  Fever of 100.5 or greater  Nausea and/or vomiting not relived with nausea medicine  Diarrhea or constipation  Severe pain not relieved with usual pain regimen  Shortness of breath  Uncontrolled bleeding  Mental status changes    Results for Brenda Mccarty, Brenda Mccarty (MRN [id number]) as of 10/22/2017 13:53   Ref. Range 10/22/2017 11:08 10/22/2017 11:08   WBC Latest Ref Range: 4.5 - 11.0 10*9/L 3.5 (L)    RBC Latest Ref Range: 4.00 - 5.20 10*12/L 2.58 (L)    HGB Latest Ref Range: 12.0 - 16.0 g/dL 9.3 (L)    HCT Latest Ref Range: 36.0 - 46.0 % 27.7 (L)    MCV Latest Ref Range: 80.0 - 100.0 fL 107.4 (H)    MCH Latest Ref Range: 26.0 - 34.0 pg 36.1 (H)    MCHC Latest Ref Range: 31.0 - 37.0 g/dL 41.3    RDW Latest Ref Range: 12.0 - 15.0 % 18.0 (H)    MPV Latest Ref Range: 7.0 - 10.0 fL 7.9    Platelet Latest Ref Range: 150 - 440 10*9/L 251    Absolute Neutrophils Latest Ref Range: 2.0 - 7.5 10*9/L 2.0    Absolute Lymphocytes Latest Ref Range: 1.5 - 5.0 10*9/L 1.1 (L)    Absolute Monocytes Latest Ref Range: 0.2 - 0.8 10*9/L 0.1 (L)    Absolute Eosinophils Latest Ref Range: 0.0 - 0.4 10*9/L 0.1    Absolute Basophils  Latest Ref Range: 0.0 - 0.1 10*9/L 0.0    Macrocytosis Latest Ref Range: Not Present  Marked (A)    Anisocytosis Latest Ref Range: Not Present  Moderate (A)    Smear Review Latest Ref Range: Undefined  See Comment (A)    Large Unstained Cells Latest Ref Range: 0 - 4 % 4    Sodium Latest Ref Range: 135 - 145 mmol/L 141    Potassium Latest Ref Range: 3.5 - 5.0 mmol/L 4.1    Chloride Latest Ref Range: 98 - 107 mmol/L 107    CO2 Latest Ref Range: 22.0 - 30.0 mmol/L 28.0    Bun Latest Ref Range: 7 - 21 mg/dL 14    Creatinine Latest Ref Range: 0.60 - 1.00 mg/dL 2.44 0.10   BUN/Creatinine Ratio Unknown 15    EGFR MDRD Non Af Amer Latest Ref Range: >=60 mL/min/1.79m2 >=60 >=60   EGFR MDRD Af Amer Latest Ref Range: >=60 mL/min/1.40m2 >=60 >=60   Anion Gap Latest Ref Range: 9 - 15 mmol/L 6 (L)    Glucose Latest Ref Range: 65 - 179 mg/dL 92    Calcium Latest Ref Range: 8.5 - 10.2 mg/dL 27.2 53.6   Albumin Latest Ref Range: 3.5 - 5.0 g/dL 4.1    Total Protein Latest Ref Range: 6.5 - 8.3 g/dL 7.6    Total Bilirubin Latest Ref Range: 0.0 - 1.2 mg/dL 1.1    AST Latest Ref Range: 14 - 38 U/L 128 (H)    ALT Latest Ref Range: 15 - 48 U/L 90 (H)    Alkaline Phosphatase Latest Ref Range: 38 - 126 U/L 232 (H)

## 2017-11-02 MED ORDER — ONDANSETRON HCL 8 MG TABLET
ORAL_TABLET | Freq: Two times a day (BID) | ORAL | 2 refills | 0.00000 days | Status: CP | PRN
Start: 2017-11-02 — End: 2017-11-16

## 2017-11-12 ENCOUNTER — Ambulatory Visit: Admit: 2017-11-12 | Discharge: 2017-11-12 | Payer: PRIVATE HEALTH INSURANCE

## 2017-11-12 ENCOUNTER — Ambulatory Visit: Admit: 2017-11-12 | Discharge: 2017-11-25 | Payer: PRIVATE HEALTH INSURANCE

## 2017-11-12 ENCOUNTER — Encounter: Admit: 2017-11-12 | Discharge: 2017-11-25 | Payer: PRIVATE HEALTH INSURANCE

## 2017-11-12 DIAGNOSIS — C50919 Malignant neoplasm of unspecified site of unspecified female breast: Principal | ICD-10-CM

## 2017-11-19 ENCOUNTER — Encounter
Admit: 2017-11-19 | Discharge: 2017-11-19 | Payer: PRIVATE HEALTH INSURANCE | Attending: Medical Oncology | Primary: Medical Oncology

## 2017-11-19 ENCOUNTER — Encounter: Admit: 2017-11-19 | Discharge: 2017-11-19 | Payer: PRIVATE HEALTH INSURANCE

## 2017-11-19 ENCOUNTER — Ambulatory Visit: Admit: 2017-11-19 | Discharge: 2017-11-19 | Payer: PRIVATE HEALTH INSURANCE

## 2017-11-19 DIAGNOSIS — Z170 Estrogen receptor positive status [ER+]: Secondary | ICD-10-CM

## 2017-11-19 DIAGNOSIS — C50811 Malignant neoplasm of overlapping sites of right female breast: Secondary | ICD-10-CM

## 2017-11-19 DIAGNOSIS — C50919 Malignant neoplasm of unspecified site of unspecified female breast: Principal | ICD-10-CM

## 2017-11-19 LAB — CBC W/ AUTO DIFF
BASOPHILS ABSOLUTE COUNT: 0 10*9/L (ref 0.0–0.1)
BASOPHILS RELATIVE PERCENT: 0.9 %
EOSINOPHILS ABSOLUTE COUNT: 0 10*9/L (ref 0.0–0.4)
EOSINOPHILS RELATIVE PERCENT: 1 %
HEMATOCRIT: 26.2 % — ABNORMAL LOW (ref 36.0–46.0)
LARGE UNSTAINED CELLS: 3 % (ref 0–4)
LYMPHOCYTES ABSOLUTE COUNT: 0.9 10*9/L — ABNORMAL LOW (ref 1.5–5.0)
LYMPHOCYTES RELATIVE PERCENT: 25.6 %
MEAN CORPUSCULAR HEMOGLOBIN CONC: 33.4 g/dL (ref 31.0–37.0)
MEAN CORPUSCULAR HEMOGLOBIN: 38.1 pg — ABNORMAL HIGH (ref 26.0–34.0)
MEAN CORPUSCULAR VOLUME: 114.2 fL — ABNORMAL HIGH (ref 80.0–100.0)
MEAN PLATELET VOLUME: 8.8 fL (ref 7.0–10.0)
MONOCYTES ABSOLUTE COUNT: 0.1 10*9/L — ABNORMAL LOW (ref 0.2–0.8)
MONOCYTES RELATIVE PERCENT: 2.8 %
NEUTROPHILS ABSOLUTE COUNT: 2.4 10*9/L (ref 2.0–7.5)
NEUTROPHILS RELATIVE PERCENT: 67.1 %
PLATELET COUNT: 224 10*9/L (ref 150–440)
RED BLOOD CELL COUNT: 2.3 10*12/L — ABNORMAL LOW (ref 4.00–5.20)
RED CELL DISTRIBUTION WIDTH: 17.9 % — ABNORMAL HIGH (ref 12.0–15.0)

## 2017-11-19 LAB — COMPREHENSIVE METABOLIC PANEL
ALBUMIN: 3.7 g/dL (ref 3.5–5.0)
ALKALINE PHOSPHATASE: 402 U/L — ABNORMAL HIGH (ref 38–126)
ALT (SGPT): 114 U/L — ABNORMAL HIGH (ref 15–48)
ANION GAP: 10 mmol/L (ref 9–15)
BILIRUBIN TOTAL: 1.7 mg/dL — ABNORMAL HIGH (ref 0.0–1.2)
BLOOD UREA NITROGEN: 18 mg/dL (ref 7–21)
BUN / CREAT RATIO: 15
CALCIUM: 9.7 mg/dL (ref 8.5–10.2)
CO2: 28 mmol/L (ref 22.0–30.0)
CREATININE: 1.22 mg/dL — ABNORMAL HIGH (ref 0.60–1.00)
EGFR MDRD AF AMER: 55 mL/min/{1.73_m2} — ABNORMAL LOW (ref >=60)
EGFR MDRD NON AF AMER: 45 mL/min/{1.73_m2} — ABNORMAL LOW (ref >=60)
GLUCOSE RANDOM: 101 mg/dL (ref 65–179)
POTASSIUM: 4.1 mmol/L (ref 3.5–5.0)
PROTEIN TOTAL: 7.4 g/dL (ref 6.5–8.3)
SODIUM: 137 mmol/L (ref 135–145)

## 2017-11-19 LAB — ALKALINE PHOSPHATASE: Alkaline phosphatase:CCnc:Pt:Ser/Plas:Qn:: 402 — ABNORMAL HIGH

## 2017-11-19 LAB — SMEAR REVIEW

## 2017-11-19 LAB — MONOCYTES ABSOLUTE COUNT: Lab: 0.1 — ABNORMAL LOW

## 2017-11-19 MED ORDER — LIDOCAINE-PRILOCAINE 2.5 %-2.5 % TOPICAL CREAM
0 refills | 0 days | Status: CP
Start: 2017-11-19 — End: 2018-11-19

## 2017-11-19 NOTE — Unmapped (Signed)
Pharmacy chemotherapy first cycle counseling    Patient Information: Brenda Mccarty is a 62 y.o. female with breast cancer who I have counseled prior to the initiation of paclitaxel (3 weeks on, 1 week off).     Chemotherapy agent, administration, and schedule were reviewed with the patient.  Side effects discussed included but were not limited to: hypersensitivity reactions, nausea/vomiting, complications of myelosuppression, muscle aches, alopecia, peripheral neuropathy, mucositis, skin/nail changes, and fatigue.  We discussed the use of anti-emetics for nausea/vomitng and low/moderate physical activity to reduce fatigue.    Drug interactions:  Medication list reviewed and update in Epic.  No drug interactions identified.    Handout provided: [place] chemotherapy regimen handout     Patient verbalized understanding.  Approximate time spent with patient:  15 minutes

## 2017-11-19 NOTE — Unmapped (Signed)
Results for Brenda Mccarty, Brenda Mccarty (MRN [id number]) as of 11/19/2017 13:16   Ref. Range 11/19/2017 08:59   WBC Latest Ref Range: 4.5 - 11.0 10*9/L 3.6 (L)   RBC Latest Ref Range: 4.00 - 5.20 10*12/L 2.30 (L)   HGB Latest Ref Range: 12.0 - 16.0 g/dL 8.7 (L)   HCT Latest Ref Range: 36.0 - 46.0 % 26.2 (L)   MCV Latest Ref Range: 80.0 - 100.0 fL 114.2 (H)   MCH Latest Ref Range: 26.0 - 34.0 pg 38.1 (H)   MCHC Latest Ref Range: 31.0 - 37.0 g/dL 84.6   RDW Latest Ref Range: 12.0 - 15.0 % 17.9 (H)   MPV Latest Ref Range: 7.0 - 10.0 fL 8.8   Platelet Latest Ref Range: 150 - 440 10*9/L 224   Neutrophils % Latest Units: % 67.1   Lymphocytes % Latest Units: % 25.6   Monocytes % Latest Units: % 2.8   Eosinophils % Latest Units: % 1.0   Basophils % Latest Units: % 0.9   Absolute Neutrophils Latest Ref Range: 2.0 - 7.5 10*9/L 2.4   Absolute Lymphocytes Latest Ref Range: 1.5 - 5.0 10*9/L 0.9 (L)   Absolute Monocytes  Latest Ref Range: 0.2 - 0.8 10*9/L 0.1 (L)   Absolute Eosinophils Latest Ref Range: 0.0 - 0.4 10*9/L 0.0   Absolute Basophils  Latest Ref Range: 0.0 - 0.1 10*9/L 0.0   Macrocytosis Latest Ref Range: Not Present  Marked (A)   Anisocytosis Latest Ref Range: Not Present  Slight (A)   Smear Review Latest Ref Range: Undefined  See Comment (A)   Large Unstained Cells Latest Ref Range: 0 - 4 % 3   Neutrophil Left Shift Latest Ref Range: Not Present  1+ (A)   Sodium Latest Ref Range: 135 - 145 mmol/L 137   Potassium Latest Ref Range: 3.5 - 5.0 mmol/L 4.1   Chloride Latest Ref Range: 98 - 107 mmol/L 99   CO2 Latest Ref Range: 22.0 - 30.0 mmol/L 28.0   Bun Latest Ref Range: 7 - 21 mg/dL 18   Creatinine Latest Ref Range: 0.60 - 1.00 mg/dL 9.62 (H)   BUN/Creatinine Ratio Unknown 15   EGFR MDRD Non Af Amer Latest Ref Range: >=60 mL/min/1.25m2 45 (L)   EGFR MDRD Af Amer Latest Ref Range: >=60 mL/min/1.65m2 55 (L)   Anion Gap Latest Ref Range: 9 - 15 mmol/L 10   Glucose Latest Ref Range: 65 - 179 mg/dL 952   Calcium Latest Ref Range: 8.5 - 10.2 mg/dL 9.7   Albumin Latest Ref Range: 3.5 - 5.0 g/dL 3.7   Total Protein Latest Ref Range: 6.5 - 8.3 g/dL 7.4   Total Bilirubin Latest Ref Range: 0.0 - 1.2 mg/dL 1.7 (H)   AST Latest Ref Range: 14 - 38 U/L 240 (H)   ALT Latest Ref Range: 15 - 48 U/L 114 (H)   Alkaline Phosphatase Latest Ref Range: 38 - 126 U/L 402 (H)       If you feel like you're having an emergency please call 911.  For appointments or questions Monday through Friday 8AM-5PM please call [phone] or Toll Free [phone]. For Medical questions or concerns ask for the Nurse Triage Line.  On Nights, Weekends, and Holidays call [phone] and ask for the Oncologist on Call.  Reasons to call the Nurse Triage Line:  Fever of 100.5 or greater  Nausea and/or vomiting not relived with nausea medicine  Diarrhea or constipation  Severe pain not relieved with usual pain regimen  Shortness of breath  Uncontrolled bleeding  Mental status changes

## 2017-11-19 NOTE — Unmapped (Signed)
Pt presents in NAD, no complaints, pain free, no negative changes since last visit. Port accessed prior to arrival to infusion center.+blood return. Pt c/o 4/10 pain from when port was accessed this AM/ Good blood return, no pain with flushing. No S/S of port issues. Tender with touch, requested something for pain. Morris AGNP.paged. Pt given Tylenol.    1320 Port de-accessed after 500 units of heparin instilled pt tolerated infusion without difficulty. NAD, no questions, no complaints at time of discharge, verbalized understanding of d/c instructions. Pt left infusion center ambulatory, steady gait. Denies anything at d/c.

## 2017-11-19 NOTE — Unmapped (Signed)
Labs drawn per pac and sent for analysis.

## 2017-11-26 ENCOUNTER — Other Ambulatory Visit
Admit: 2017-11-26 | Discharge: 2017-11-29 | Disposition: A | Discharge status: Home / Self Care | Payer: PRIVATE HEALTH INSURANCE | Admitting: Hematology & Oncology

## 2017-11-26 ENCOUNTER — Ambulatory Visit
Admit: 2017-11-26 | Discharge: 2017-11-29 | Disposition: A | Discharge status: Home / Self Care | Payer: PRIVATE HEALTH INSURANCE | Admitting: Hematology & Oncology

## 2017-11-26 DIAGNOSIS — C787 Secondary malignant neoplasm of liver and intrahepatic bile duct: Principal | ICD-10-CM

## 2017-11-26 DIAGNOSIS — C50919 Malignant neoplasm of unspecified site of unspecified female breast: Principal | ICD-10-CM

## 2017-11-26 DIAGNOSIS — C50811 Malignant neoplasm of overlapping sites of right female breast: Principal | ICD-10-CM

## 2017-11-26 DIAGNOSIS — Z170 Estrogen receptor positive status [ER+]: Secondary | ICD-10-CM

## 2017-11-26 LAB — CBC W/ AUTO DIFF
BASOPHILS RELATIVE PERCENT: 1 %
EOSINOPHILS ABSOLUTE COUNT: 0 10*9/L (ref 0.0–0.4)
EOSINOPHILS RELATIVE PERCENT: 0.6 %
HEMOGLOBIN: 8.6 g/dL — ABNORMAL LOW (ref 12.0–16.0)
LARGE UNSTAINED CELLS: 4 % (ref 0–4)
LYMPHOCYTES ABSOLUTE COUNT: 0.9 10*9/L — ABNORMAL LOW (ref 1.5–5.0)
LYMPHOCYTES RELATIVE PERCENT: 21.5 %
MEAN CORPUSCULAR HEMOGLOBIN CONC: 33.2 g/dL (ref 31.0–37.0)
MEAN CORPUSCULAR HEMOGLOBIN: 39 pg — ABNORMAL HIGH (ref 26.0–34.0)
MEAN CORPUSCULAR VOLUME: 117.3 fL — ABNORMAL HIGH (ref 80.0–100.0)
MEAN PLATELET VOLUME: 9.2 fL (ref 7.0–10.0)
MONOCYTES ABSOLUTE COUNT: 0.4 10*9/L (ref 0.2–0.8)
MONOCYTES RELATIVE PERCENT: 9.8 %
NEUTROPHILS ABSOLUTE COUNT: 2.7 10*9/L (ref 2.0–7.5)
NEUTROPHILS RELATIVE PERCENT: 62.9 %
PLATELET COUNT: 243 10*9/L (ref 150–440)
RED BLOOD CELL COUNT: 2.2 10*12/L — ABNORMAL LOW (ref 4.00–5.20)
RED CELL DISTRIBUTION WIDTH: 18.4 % — ABNORMAL HIGH (ref 12.0–15.0)
WBC ADJUSTED: 4.2 10*9/L — ABNORMAL LOW (ref 4.5–11.0)

## 2017-11-26 LAB — COMPREHENSIVE METABOLIC PANEL
ALKALINE PHOSPHATASE: 486 U/L — ABNORMAL HIGH (ref 38–126)
ALT (SGPT): 105 U/L — ABNORMAL HIGH (ref 15–48)
ANION GAP: 8 mmol/L — ABNORMAL LOW (ref 9–15)
BLOOD UREA NITROGEN: 19 mg/dL (ref 7–21)
BUN / CREAT RATIO: 15
CALCIUM: 9.6 mg/dL (ref 8.5–10.2)
CHLORIDE: 101 mmol/L (ref 98–107)
CO2: 27 mmol/L (ref 22.0–30.0)
CREATININE: 1.23 mg/dL — ABNORMAL HIGH (ref 0.60–1.00)
EGFR MDRD AF AMER: 54 mL/min/{1.73_m2} — ABNORMAL LOW (ref >=60)
EGFR MDRD NON AF AMER: 45 mL/min/{1.73_m2} — ABNORMAL LOW (ref >=60)
GLUCOSE RANDOM: 112 mg/dL (ref 65–179)
POTASSIUM: 3.8 mmol/L (ref 3.5–5.0)
PROTEIN TOTAL: 7.5 g/dL (ref 6.5–8.3)
SODIUM: 136 mmol/L (ref 135–145)

## 2017-11-26 LAB — BILIRUBIN DIRECT: Bilirubin.glucuronidated:MCnc:Pt:Ser/Plas:Qn:: 2.8 — ABNORMAL HIGH

## 2017-11-26 LAB — ALBUMIN: Albumin:MCnc:Pt:Ser/Plas:Qn:: 3.5

## 2017-11-26 LAB — LYMPHOCYTES RELATIVE PERCENT: Lab: 21.5

## 2017-11-26 NOTE — Unmapped (Signed)
Please complete the MRI screening form in EPIC in order to proceed with exam.

## 2017-11-26 NOTE — Unmapped (Addendum)
Patient presented for C1D1 Taxol but labs indicate bili 3.7, along with elevated hepatic functions. NP paged, chemotherapy held for today. Spoke with patient at chairside, plan on admission. MRI checklist completed. Left with transport in route to MRI via transport chair.

## 2017-11-26 NOTE — Unmapped (Signed)
Labs drawn and sent -

## 2017-11-27 LAB — COMPREHENSIVE METABOLIC PANEL
ALBUMIN: 3.3 g/dL — ABNORMAL LOW (ref 3.5–5.0)
ALKALINE PHOSPHATASE: 478 U/L — ABNORMAL HIGH (ref 38–126)
ALT (SGPT): 107 U/L — ABNORMAL HIGH (ref 15–48)
ANION GAP: 6 mmol/L — ABNORMAL LOW (ref 9–15)
AST (SGOT): 283 U/L — ABNORMAL HIGH (ref 14–38)
BLOOD UREA NITROGEN: 15 mg/dL (ref 7–21)
BUN / CREAT RATIO: 15
CALCIUM: 9.6 mg/dL (ref 8.5–10.2)
CHLORIDE: 105 mmol/L (ref 98–107)
CO2: 29 mmol/L (ref 22.0–30.0)
CREATININE: 1.01 mg/dL — ABNORMAL HIGH (ref 0.60–1.00)
EGFR MDRD NON AF AMER: 56 mL/min/{1.73_m2} — ABNORMAL LOW (ref >=60)
GLUCOSE RANDOM: 120 mg/dL (ref 65–179)
POTASSIUM: 3.8 mmol/L (ref 3.5–5.0)
PROTEIN TOTAL: 7 g/dL (ref 6.5–8.3)
SODIUM: 140 mmol/L (ref 135–145)

## 2017-11-27 LAB — CBC W/ AUTO DIFF
BASOPHILS ABSOLUTE COUNT: 0 10*9/L (ref 0.0–0.1)
BASOPHILS RELATIVE PERCENT: 0.7 %
EOSINOPHILS ABSOLUTE COUNT: 0 10*9/L (ref 0.0–0.4)
EOSINOPHILS RELATIVE PERCENT: 0.6 %
HEMATOCRIT: 25 % — ABNORMAL LOW (ref 36.0–46.0)
HEMOGLOBIN: 7.8 g/dL — ABNORMAL LOW (ref 12.0–16.0)
LARGE UNSTAINED CELLS: 4 % (ref 0–4)
LYMPHOCYTES ABSOLUTE COUNT: 1.2 10*9/L — ABNORMAL LOW (ref 1.5–5.0)
LYMPHOCYTES RELATIVE PERCENT: 24.3 %
MEAN CORPUSCULAR HEMOGLOBIN CONC: 31.4 g/dL (ref 31.0–37.0)
MEAN CORPUSCULAR HEMOGLOBIN: 37 pg — ABNORMAL HIGH (ref 26.0–34.0)
MEAN CORPUSCULAR VOLUME: 117.7 fL — ABNORMAL HIGH (ref 80.0–100.0)
MEAN PLATELET VOLUME: 8.4 fL (ref 7.0–10.0)
MONOCYTES ABSOLUTE COUNT: 0.5 10*9/L (ref 0.2–0.8)
MONOCYTES RELATIVE PERCENT: 9.2 %
NEUTROPHILS ABSOLUTE COUNT: 3.1 10*9/L (ref 2.0–7.5)
NEUTROPHILS RELATIVE PERCENT: 61 %
PLATELET COUNT: 240 10*9/L (ref 150–440)
RED CELL DISTRIBUTION WIDTH: 19.2 % — ABNORMAL HIGH (ref 12.0–15.0)

## 2017-11-27 LAB — TARGET CELLS

## 2017-11-27 LAB — PHOSPHORUS: Phosphate:MCnc:Pt:Ser/Plas:Qn:: 2.4 — ABNORMAL LOW

## 2017-11-27 LAB — SLIDE REVIEW

## 2017-11-27 LAB — VITAMIN B-12: Cobalamins:MCnc:Pt:Ser/Plas:Qn:: >1000 — ABNORMAL HIGH

## 2017-11-27 LAB — WBC ADJUSTED: Lab: 5

## 2017-11-27 LAB — THYROID STIMULATING HORMONE: Thyrotropin:ACnc:Pt:Ser/Plas:Qn:: 2.985

## 2017-11-27 LAB — POTASSIUM: Potassium:SCnc:Pt:Ser/Plas:Qn:: 3.8

## 2017-11-27 LAB — FOLATE: Folate:MCnc:Pt:Ser/Plas:Qn:: 13.5

## 2017-11-27 LAB — PRO-BNP: Natriuretic peptide.B prohormone N-Terminal:MCnc:Pt:Ser/Plas:Qn:: 108

## 2017-11-27 LAB — LIPASE: Triacylglycerol lipase:CCnc:Pt:Ser/Plas:Qn:: 141

## 2017-11-27 LAB — MAGNESIUM: Magnesium:MCnc:Pt:Ser/Plas:Qn:: 2

## 2017-11-27 NOTE — Unmapped (Signed)
Problem: Patient Care Overview  Goal: Plan of Care Review  Outcome: Progressing  Pt admitted to 4 onc and oriented to room and unit. VSS overnight, afebrile, no falls, A&Ox4. LR given over 5 hours per order. Pt NPO since midnights. Pt reports no pain. Will continue to monitor.    Problem: Fall Risk (Adult)  Goal: Identify Related Risk Factors and Signs and Symptoms  Related risk factors and signs and symptoms are identified upon initiation of Human Response Clinical Practice Guideline (CPG).   Outcome: Progressing    Goal: Absence of Fall  Patient will demonstrate the desired outcomes by discharge/transition of care.   Outcome: Progressing

## 2017-11-27 NOTE — Unmapped (Signed)
Medicine History and Physical    Assessment/Plan:    Principal Problem:    Hyperbilirubinemia  Active Problems:    Metastatic breast cancer (CMS-HCC)    Abdominal pain    Left hip pain  Resolved Problems:    * No resolved hospital problems. *      Brenda Mccarty is a 62 y.o. 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     Abdominal pain with elevated liver enzymes, t.bili: Admitted from infusion clinic when pre-chemotherapy labs were significant for AST of 305, ALT of 105, alk phos of 486, t.bili of 3.7, abd direct bili of 2.8. Bilirubin normal at the beginning of February. Patient reports 3 weeks of right sided abdominal pain and nausea. Denies fevers or chills. Not concerned for infection at this time, but low threshold to culture and add on abx for biliary coverage if she fevers or WBC rises.    - follow up lipase  - follow-up acute hepatitis panel  - Follow up MRCP  - consult biliary GI in the am     Metastatic ER + PR- HER 2 - breast cancer to bone, liver, lungs: First noticed symptoms in September 2018 with bilateral breast swelling, then sent right breast swelling with skin dimpling. Found to have ER + PR - HER-2 negative breast cancer and right breast biopsy.  Bone scan in November 2018 with 90 months to the right ilium, ribs, humerus, lumbosacral spine.  CT scan from November showed liver metastases and pulmonary metastases.  She was started on monthly Zometa in November 2018.  Underwent liver biopsy in November which confirmed liver metastases.  Started letrozole and Ibrance in November 2018 and stopped last week after CT abdomen and pelvis showed progression of hepatic and osseous lesions. She arrived to infusion clinic today to start day 1 cycle 1 of paclitaxel, but pre chemo labs were notable for elevated LFTs and elevated bilirubin.  Given the patient also had abdominal pain she was admitted for workup.     AKI: Baseline creatinine is .7-.8. Cr of 1.23 on arrival. Likely pre-renal as patient has had very poor oral intake over past 3 weeks due to worsening abdominal pain and nausea.   - will give LR 100 ml/hr over 5 hours   - daily BMP    Lower extremity edema w/exertional dyspnea: Patient with lower extremity edema on arrival and reports shortness of breath with exertion. She denies orthopnea and paroxysmal nocturnal dyspnea. No wheezing or crackles on exam. ECHO from 06/2017 shows EF 60-65%, mild tricuspid regurgitation, and normal right ventricular systolic function. Will check pro bnp, but suspect lower extremity edema is due to venous congestion in the setting of worsening liver mets. Albumin has also trended down over the past 2 months (4.7 in January to 3.5 today) in the setting of poor appetite.   - follow up pro-BNP  - follow up CXR    Left hip pain: Patient with several weeks of left hip pain. This may be due to bony metastasis, but she says she was diagnosed with bursitis by a physician and [facility] recently. She has been using heat and naproxen as needed for pain  - tylenol prn  - Voltaren gel     Macrocytic anemia: Baseline Hgb 9-10. Hgb of 8.7  on admission. Patient states she has had 1 episode of wiping with blood on the toilet paper which seems consistent with irritation due to recent constipation. Macrocytosis is recent and may coincide with nutritional deficiency in the setting of down trending albumin and poor appetite  - Follow up B12, folate  - Daily CBC    Hx of HTN: Takes lisinopril 5 mg daily at home. BP on arrival was 120/68.   - Hold home lisinopril for now in the setting of AKI    ___________________________________________________________________      Hyperbilirubinemia    HPI:  Brenda Mccarty is a 62 y.o. female with PMH of hypertension and ER + PR - HER-2 negative breast cancer with metastasis to liver, lung, and bone who was admitted from infusion clinic due to 3 weeks of nausea and abdominal pain with elevated LFTs and t.bili.     Patient states that for the past 3 weeks she has noticed right-sided abdominal pain associated with nausea.  She has had reduced oral intake due to lack of appetite and early satiety.  She denies being around anyone with diarrhea, nausea, or vomiting.  She states that this abdominal pain over the last 3 weeks as needed for her and the nausea is abnormal as well.  She has had intermittent diarrhea and constipation over the last several weeks, she describes constipation. She states that several days ago she wiped and did find blood on the toilet paper but this was an isolated incident.  She denies blood in her stool.  She denies fevers, chills, upper respiratory symptoms such as nasal congestion runny nose, or cough.  She has noticed lower extremity edema over the past several weeks and dyspnea on exertion, but denies orthopnea or paroxysmal nocturnal dyspnea. She denies pleuritic chest pain she does not have shortness of breath at rest.     The patient presented to infusion clinic today to start cycle 1 day 1 of paclitaxel.  Labs were drawn prior to chemo and were significant for T bili of 3.7 direct bili of 2.8, AST of 305 ALT of 105 and alk phos of 486.  Otherwise the patient was afebrile, normotensive, with normal heart rate.         Allergies:  Tramadol    Medications:   Prior to Admission medications    Medication Dose, Route, Frequency   ibuprofen (ADVIL,MOTRIN) 200 MG tablet 200 mg, Oral   lisinopril (PRINIVIL,ZESTRIL) 5 MG tablet 5 mg, Oral, Daily (standard)   ondansetron (ZOFRAN) 4 MG tablet 4 mg, Oral, Every 8 hours PRN   acetaminophen (TYLENOL) 500 MG tablet 500 mg, Oral   calcium carbonate (CALCIUM 500) 500 mg calcium (1,250 mg) chewable tablet 1 tablet, Oral, Daily (standard)   lidocaine-prilocaine (EMLA) cream Apply to affected area once  Patient not taking: Reported on 11/26/2017   MULTI-VITAMIN WITH IRON ORAL 1 tablet, Oral, Daily (standard)       Medical History:  Past Medical History:   Diagnosis Date   ??? Breast cancer (CMS-HCC) 06/2017    T3 pathologic N1 right breast cancer which was ER positive PR negative HER-2 negative; mets to bone, liver and lung   ??? Hypertension        Surgical History:  Past Surgical History:   Procedure Laterality Date   ??? IR INSERT PORT AGE GREATER THAN 5 YRS  07/22/2017    IR INSERT PORT AGE GREATER THAN 5 YRS 07/22/2017 Maree Erie, MD IMG [facility]   ??? MYOMECTOMY  Social History:  Social History     Social History   ??? Marital status: Single     Spouse name: N/A   ??? Number of children: N/A   ??? Years of education: N/A     Occupational History   ??? Not on file.     Social History Main Topics   ??? Smoking status: Never Smoker   ??? Smokeless tobacco: Never Used   ??? Alcohol use No   ??? Drug use: No   ??? Sexual activity: Not on file     Other Topics Concern   ??? Not on file     Social History Narrative    Lives alone    Employment: retail        Caffeine: none    Exercise: sometimes    Seatbelts: regularly    Cell phone while driving: rarely    Sunscreen: sometimes    Dental care: regularly    Eye care: overdue       Family History:  Family History   Problem Relation Age of Onset   ??? Heart attack Mother    ??? Pancreatic cancer Father    ??? Breast cancer Sister         Deceased of BCA at 57y/o   ??? Sarcoidosis Brother    ??? Breast cancer Sister         Alive, Dx BCA at 25 y/o-remission doing well   ??? Kidney cancer Sister    ??? Breast cancer Other         Niece Dx 45 y/o-remission doing well       Review of Systems:  10 systems reviewed and are negative unless otherwise mentioned in HPI    Labs/Studies:  Labs and Studies from the last 24hrs per EMR and Reviewed    Physical Exam:  General: Pleasant female, appears younger than stated age, sitting up in chair in no acute distress  HEENT: Scleral icterus noted  CV: Left chest port in place without erythema or signs of infection, regular rate and rhythm, no murmurs  Pulmonary: Clear to auscultation bilaterally, no wheezing, no crackles  Abdomen: Abdomen is mildly tender to palpation over the right upper quadrant, no rebound or guarding, marked hepatomegaly is noted  Ext: 1+ pitting edema of the legs bilaterally   Skin: No rashes or lesions noted

## 2017-11-27 NOTE — Unmapped (Signed)
Medicine Daily Progress Note    Assessment/Plan:  Principal Problem:    Hyperbilirubinemia  Active Problems:    Metastatic breast cancer (CMS-HCC)    Abdominal pain    Left hip pain  Resolved Problems:    * No resolved hospital problems. *           ??  Brenda Mccarty is a 62 y.o. 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   ??  Abdominal pain with elevated liver enzymes, t.bili 2/2 to compression from increased liver metastases: Admitted from infusion clinic when pre-chemotherapy labs were significant for AST of 305, ALT of 105, alk phos of 486, t.bili of 3.7, abd direct bili of 2.8. Bilirubin normal at the beginning of February. Total Bilirubin has risen to 4.1 this am.  MRCP performed on 3/8 with increased hepatic metastasis involving all lobes with mild distal intrahepatic biliary ductal dilation due to large central hepatic tumor burden. Biliary GI has seen her and there is no utility in stenting as the small ducts that may be amenable to stenting would not result in clinically significant biliary drainage.   - Per Dr. Avis Epley, plan for chemotherapy (likely dose reduced taxol as it is metabolized by the liver) to start on 3/10   - follow-up acute hepatitis panel    Metastatic ER + PR- HER 2 - breast cancer to bone, liver, lungs: First noticed symptoms in September 2018 with bilateral breast swelling, then sent right breast swelling with skin dimpling. Found to have ER + PR - HER-2 negative breast cancer and right breast biopsy.  Bone scan in November 2018 with 90 months to the right ilium, ribs, humerus, lumbosacral spine.  CT scan from November showed liver metastases and pulmonary metastases.  She was started on monthly Zometa in November 2018. Underwent liver biopsy in November which confirmed liver metastases. Started letrozole and Ibrance in November 2018 and stopped last week after CT abdomen and pelvis showed progression of hepatic and osseous lesions. She arrived to infusion clinic on 11/26/17 to start day 1 cycle 1 of paclitaxel, but pre chemo labs were notable for elevated LFTs and elevated bilirubin. Given the patient also had abdominal pain she was admitted for workup. Now with worsening liver metastasis and t.bili continues to rise.  - Per Dr. Avis Epley, plan for chemotherapy (likely dose reduced taxol as it is metabolized by the liver) to start on 3/10   ??  AKI: Baseline creatinine is .7-.8. Cr of 1.23 on arrival. Likely pre-renal as patient has had very poor oral intake over past 3 weeks due to worsening abdominal pain and nausea. Cr 1.01 this am after fluids  - will give LR 100 ml/hr over 5 hours again today  - daily BMP  ??  Lower extremity edema w/exertional dyspnea: Patient with lower extremity edema on arrival and reports shortness of breath with exertion. She denies orthopnea and paroxysmal nocturnal dyspnea. No wheezing or crackles on exam. ECHO from 06/2017 shows EF 60-65%, mild tricuspid regurgitation, and normal right ventricular systolic function. Will check pro bnp, but suspect lower extremity edema is due to venous congestion in the setting of worsening liver mets. Albumin has also trended down over the past 2 months (4.7 in January to 3.3 today) in the setting of poor appetite. Pro  BNP is not elevated (108) and CXR is clear so less likely cardiac source.  - treatment of malignancy as above  - gentle with fluids  ??  Left hip pain: Patient with several weeks of left hip pain. This may be due to bony metastasis, but she says she was diagnosed with bursitis by a physician and [facility] recently. She has been using heat and naproxen as needed for pain. Voltaren has been helping while hospitalized.  - tylenol prn  - Voltaren gel   ??  Macrocytic anemia: Baseline Hgb 9-10. Hgb of 8.7 on admission. Patient states she has had 1 episode of wiping with blood on the toilet paper which seems consistent with irritation due to recent constipation. Macrocytosis is recent and may coincide with nutritional deficiency in the setting of down trending albumin and poor appetite. B12 and folate are normal.   - Daily CBC  ??  Hx of HTN: Takes lisinopril 5 mg daily at home. BP on arrival was 120/68.   - Hold home lisinopril for now in the setting of AKI    ___________________________________________________________________    Subjective:  No acute events overnight. Spoke with patient about increasing liver mets and rising t.bili. Would like to start chemotherapy soon. She would like to start chemotherapy inpatient, tomorrow if possible instead of waiting until Tuesday to do it outpatient.     Labs/Studies:  Labs and Studies from the last 24hrs per EMR and Reviewed    Pressure Ulcer(s)    Active Pressure Ulcer     None              Vitals:    11/27/17 1132   BP: 131/75   Pulse: 67   Resp: 16   Temp: 37 ??C   SpO2: 100%       Objective:  General: Pleasant female, appears younger than stated age, sitting up in chair in no acute distress  HEENT: Scleral icterus noted  CV: Left chest port in place without erythema or signs of infection, regular rate and rhythm, no murmurs  Pulmonary: Clear to auscultation bilaterally, no wheezing, no crackles  Abdomen: Abdomen is mildly tender to palpation over the right upper quadrant, no rebound or guarding, marked hepatomegaly is noted  Ext: 1+ pitting edema of the legs bilaterally   Skin: No rashes or lesions noted

## 2017-11-27 NOTE — Unmapped (Signed)
ADVANCED/BILIARY INPATIENT CONSULTATION H&P      Requesting Attending Physician:  Merrilyn Puma*  Requesting Consult Service: [facility] Solid Tumor    Reason for Consult:    Brenda Mccarty is a 62 y.o. female seen in consultation at the request of Dr. Merrilyn Puma* for evaluation of jaundice.    Assessment/Plan:      Brenda Mccarty is a 62 y.o. woman with past medical history of widely metastatic breast cancer and HTN who presents from infusion clinic with subacute abdominal pain, nausea, vomiting, and abnormal LFTs. Elevated bilirubin to 3-4, moderately elevated alkaline phophatase, and AST predominant increase in transaminases is suggestive of an obstructive cause, but given lack of extrahepatic or significant intrahepatic ductal dilation purely infiltrative disease must be considered as well. The cause of obstruction would be most likely malignancy given the significant tumor burden and absence of stones in the gallbladder. She does not appear cholangitic.     Unfortunately review of her images shows little to pursue with stenting. There may be small ducts that could be stented, but this would not result in a clinically significant improvement in biliary drainage.    - No indication for biliary intervention at this time.    Thank you for this consult. They patient was Discussed with  Dr. Josetta Huddle. We will continue to follow along. Please page advanced/biliary fellow on call with questions.     Brenda Oris, MD  Gastroenterology and Hepatology Fellow    History of Present Illness:      Chief Complaint: Nausea    Brenda Mccarty is a 62 y.o. woman with past medical history of widely metastatic breast cancer and HTN who presents from infusion clinic with subacute abdominal pain, nausea, vomiting, and abnormal LFTs.    She describes onset of nausea and vomiting about three weeks prior, having an episode of emesis after taking a tramadol for her legs. She started taking nausea medicine and vomiting mostly remitted. Her last episode of vomiting was last Tuesday. She felt anorexic, food didn't taste good, and she had vague discomfort in her abdomen. She denies frank pain or any fever. She has not noticed any jaundice or change in stools.    Review of Systems:  The balance of 12 systems reviewed is negative except as noted in the HPI.     Medical History:     Past Medical History:   Diagnosis Date   ??? Breast cancer (CMS-HCC) 06/2017    T3 pathologic N1 right breast cancer which was ER positive PR negative HER-2 negative; mets to bone, liver and lung   ??? Hypertension      Past Surgical History:   Procedure Laterality Date   ??? IR INSERT PORT AGE GREATER THAN 5 YRS  07/22/2017    IR INSERT PORT AGE GREATER THAN 5 YRS 07/22/2017 Maree Erie, MD [facility]   ??? MYOMECTOMY       Family History   Problem Relation Age of Onset   ??? Heart attack Mother    ??? Pancreatic cancer Father    ??? Breast cancer Sister         Deceased of BCA at 57y/o   ??? Sarcoidosis Brother    ??? Breast cancer Sister         Alive, Dx BCA at 70 y/o-remission doing well   ??? Kidney cancer Sister    ??? Breast cancer Other         Niece Dx 70 y/o-remission  doing well     Prescriptions Prior to Admission   Medication Sig Dispense Refill Last Dose   ??? ibuprofen (ADVIL,MOTRIN) 200 MG tablet Take 200 mg by mouth.   11/25/2017 at Unknown time   ??? lisinopril (PRINIVIL,ZESTRIL) 5 MG tablet Take 1 tablet (5 mg total) by mouth daily. 90 tablet 3 Past Month at Unknown time   ??? ondansetron (ZOFRAN) 4 MG tablet Take 4 mg by mouth every eight (8) hours as needed for nausea.   11/25/2017 at Unknown time   ??? acetaminophen (TYLENOL) 500 MG tablet Take 500 mg by mouth.   Not Taking at Unknown time   ??? calcium carbonate (CALCIUM 500) 500 mg calcium (1,250 mg) chewable tablet Chew 1 tablet daily.   Not Taking at Unknown time   ??? lidocaine-prilocaine (EMLA) cream Apply to affected area once (Patient not taking: Reported on 11/26/2017) 5 g 0 Not Taking at Unknown time   ??? MULTI-VITAMIN WITH IRON ORAL Take 1 tablet by mouth daily.   Not Taking at Unknown time     Allergies   Allergen Reactions   ??? Tramadol Nausea And Vomiting     Social History   Substance Use Topics   ??? Smoking status: Never Smoker   ??? Smokeless tobacco: Never Used   ??? Alcohol use No     Objective:      Physical Exam:  Temp:  [36.7 ??C-37 ??C] 36.7 ??C  Heart Rate:  [71-98] 71  Resp:  [16-18] 16  BP: (116-134)/(66-72) 120/71  SpO2:  [100 %] 100 %    General appearance: Appears well, no distress.  Eyes: Anicteric sclera. No erythema.  ENT: No oral ulcers. Posterior oropharynx unremarkable.  Cardiovascular: RRR without murmurs, heaves, or thrills. No lower extremity edema.  Pulmonary: Normal work of breathing. Acyanotic.  Abdominal: soft, nontender, nondistended, no masses or organomegaly.  Musculoskeletal: No temporal wasting. Normal joints of the hand.  Skin: No jaundice. No rashes.  Neurologic: Alert, oriented, and appropriate.  Psychiatric: Appropriate.      Diagnostic Studies:  Labs:    CBC:  Recent Labs  Lab Units 11/27/17  0016 11/26/17  0835   WBC 10*9/L 5.0 4.2*   NEUTRO ABS 10*9/L 3.1 2.7   HEMOGLOBIN g/dL 7.8* 8.6*   PLATELET COUNT (1) 10*9/L 240 243     BMP:  Recent Labs  Lab Units 11/27/17  0016 11/26/17  0835   SODIUM mmol/L 140 136   POTASSIUM mmol/L 3.8 3.8   CHLORIDE mmol/L 105 101   CO2 mmol/L 29.0 27.0   BUN mg/dL 15 19   CREATININE mg/dL 1.61* 0.96*   BUN / CREAT RATIO  15 15   GLUCOSE mg/dL 045 409   CALCIUM mg/dL 9.6 9.6     Liver panel:  Recent Labs  Lab Units 11/27/17  0016 11/26/17  0835   ALBUMIN g/dL 3.3* 3.5   PROTEIN TOTAL g/dL 7.0 7.5   BILIRUBIN TOTAL mg/dL 4.1* 3.7*   BILIRUBIN DIRECT mg/dL  --  8.11*   AST U/L 914* 305*   ALT U/L 107* 105*   ALK PHOS U/L 478* 486*     Anemia:  MCV   Date Value Ref Range Status   11/27/2017 117.7 (H) 80.0 - 100.0 fL Final     RDW   Date Value Ref Range Status   11/27/2017 19.2 (H) 12.0 - 15.0 % Final     Folate   Date Value Ref Range Status   11/27/2017 13.5 2.7 -  20.0 ng/mL Final     Vitamin B-12   Date Value Ref Range Status   11/27/2017 >1,000 (H) 193 - 900 pg/ml Final     Luminal etiologies:  TSH   Date Value Ref Range Status   11/27/2017 2.985 0.600 - 3.300 uIU/mL Final     Liver etiologies:  Hepatitis C Ab   Date Value Ref Range Status   08/20/2017 Nonreactive Nonreactive Final     Comment:       Antibodies to HCV were not detected.  A nonreactive result does not exclude the possibility of exposure to HCV.     Imaging:   Mri Abdomen W Wo Contrast Mrcp - 11/26/2017 - Extensive hepatic metastatic disease involving all lobes, mildly increased from prior. -- 0.8 cm filling defect within the upper common hepatic duct is indeterminate, but favored to represent a crossing vessel. Mild distal intrahepatic biliary ductal dilation likely due to large central hepatic tumor burden. -- Mild interval increase in extensive osseous metastatic disease involving nearly all levels of the spine, the entire right sacral ala and much of the adjacent right iliac bone, correlating with recent bone scan. Additionally, there is a large enhancing lesion in the proximal right humerus, with suggestion of cortical breakthrough, which could represent pathologic fracture. A similar lesion is also present on the left. Correlation with physical exam and dedicated humerus radiographs recommended.    Microbiology:  None.    GI Procedures:   None on record.

## 2017-11-28 LAB — CBC W/ AUTO DIFF
BASOPHILS RELATIVE PERCENT: 0.8 %
EOSINOPHILS ABSOLUTE COUNT: 0 10*9/L (ref 0.0–0.4)
EOSINOPHILS RELATIVE PERCENT: 0.7 %
HEMATOCRIT: 23.7 % — ABNORMAL LOW (ref 36.0–46.0)
HEMOGLOBIN: 7.3 g/dL — ABNORMAL LOW (ref 12.0–16.0)
LARGE UNSTAINED CELLS: 5 % — ABNORMAL HIGH (ref 0–4)
LYMPHOCYTES ABSOLUTE COUNT: 1.4 10*9/L — ABNORMAL LOW (ref 1.5–5.0)
LYMPHOCYTES RELATIVE PERCENT: 27 %
MEAN CORPUSCULAR HEMOGLOBIN CONC: 30.8 g/dL — ABNORMAL LOW (ref 31.0–37.0)
MEAN CORPUSCULAR HEMOGLOBIN: 36.4 pg — ABNORMAL HIGH (ref 26.0–34.0)
MEAN CORPUSCULAR VOLUME: 118.2 fL — ABNORMAL HIGH (ref 80.0–100.0)
MEAN PLATELET VOLUME: 8.6 fL (ref 7.0–10.0)
MONOCYTES ABSOLUTE COUNT: 0.6 10*9/L (ref 0.2–0.8)
MONOCYTES RELATIVE PERCENT: 10.7 %
NEUTROPHILS ABSOLUTE COUNT: 2.9 10*9/L (ref 2.0–7.5)
NEUTROPHILS RELATIVE PERCENT: 55.7 %
PLATELET COUNT: 258 10*9/L (ref 150–440)
RED BLOOD CELL COUNT: 2 10*12/L — ABNORMAL LOW (ref 4.00–5.20)
RED CELL DISTRIBUTION WIDTH: 19.6 % — ABNORMAL HIGH (ref 12.0–15.0)
WBC ADJUSTED: 5.2 10*9/L (ref 4.5–11.0)

## 2017-11-28 LAB — EGFR MDRD NON AF AMER: Glomerular filtration rate/1.73 sq M.predicted.non black:ArVRat:Pt:Ser/Plas/Bld:Qn:Creatinine-based formula (MDRD): >=60

## 2017-11-28 LAB — PHOSPHORUS: Phosphate:MCnc:Pt:Ser/Plas:Qn:: 3.1

## 2017-11-28 LAB — COMPREHENSIVE METABOLIC PANEL
ALBUMIN: 3 g/dL — ABNORMAL LOW (ref 3.5–5.0)
ALKALINE PHOSPHATASE: 408 U/L — ABNORMAL HIGH (ref 38–126)
ALT (SGPT): 114 U/L — ABNORMAL HIGH (ref 15–48)
ANION GAP: 5 mmol/L — ABNORMAL LOW (ref 9–15)
AST (SGOT): 315 U/L — ABNORMAL HIGH (ref 14–38)
BILIRUBIN TOTAL: 3.9 mg/dL — ABNORMAL HIGH (ref 0.0–1.2)
BLOOD UREA NITROGEN: 11 mg/dL (ref 7–21)
BUN / CREAT RATIO: 14
CALCIUM: 8.9 mg/dL (ref 8.5–10.2)
CHLORIDE: 104 mmol/L (ref 98–107)
CO2: 29 mmol/L (ref 22.0–30.0)
CREATININE: 0.78 mg/dL (ref 0.60–1.00)
EGFR MDRD AF AMER: >=60 mL/min/{1.73_m2} (ref >=60)
EGFR MDRD NON AF AMER: >=60 mL/min/{1.73_m2} (ref >=60)
POTASSIUM: 4 mmol/L (ref 3.5–5.0)
PROTEIN TOTAL: 6.4 g/dL — ABNORMAL LOW (ref 6.5–8.3)
SODIUM: 138 mmol/L (ref 135–145)

## 2017-11-28 LAB — MAGNESIUM: Magnesium:MCnc:Pt:Ser/Plas:Qn:: 1.8

## 2017-11-28 LAB — LARGE UNSTAINED CELLS: Lab: 5 — ABNORMAL HIGH

## 2017-11-28 MED ORDER — POLYETHYLENE GLYCOL 3350 17 GRAM ORAL POWDER PACKET: 17 g | packet | Freq: Every day | 0 refills | 0 days | Status: AC

## 2017-11-28 MED ORDER — SENNOSIDES 8.6 MG TABLET: 2 | tablet | Freq: Every evening | 0 refills | 0 days | Status: AC

## 2017-11-28 MED ORDER — POLYETHYLENE GLYCOL 3350 17 GRAM ORAL POWDER PACKET
PACK | Freq: Every day | ORAL | 0 refills | 0.00000 days | Status: CP | PRN
Start: 2017-11-28 — End: 2017-11-28

## 2017-11-28 MED ORDER — ONDANSETRON HCL 4 MG TABLET: each | 0 refills | 0 days

## 2017-11-28 MED ORDER — ONDANSETRON HCL 4 MG TABLET: 4 mg | each | 0 refills | 0 days

## 2017-11-28 MED ORDER — ONDANSETRON HCL 4 MG TABLET
ORAL_TABLET | Freq: Three times a day (TID) | ORAL | 0 refills | 0.00000 days | Status: CP | PRN
Start: 2017-11-28 — End: 2017-11-28

## 2017-11-28 MED ORDER — SENNOSIDES 8.6 MG TABLET: 2 | tablet | 0 refills | 0 days

## 2017-11-28 MED ORDER — POLYETHYLENE GLYCOL 3350 17 GRAM ORAL POWDER PACKET: 17 g | packet | Freq: Two times a day (BID) | 0 refills | 0 days | Status: AC

## 2017-11-28 MED ORDER — POLYETHYLENE GLYCOL 3350 17 GRAM ORAL POWDER PACKET: each | 0 refills | 0 days

## 2017-11-28 MED ORDER — ONDANSETRON HCL 4 MG TABLET: 4 mg | tablet | Freq: Three times a day (TID) | 0 refills | 0 days | Status: AC

## 2017-11-28 MED ORDER — SENNOSIDES 8.6 MG TABLET
Freq: Every evening | ORAL | 0 refills | 0.00000 days | Status: CP
Start: 2017-11-28 — End: 2017-11-28

## 2017-11-28 MED ORDER — POLYETHYLENE GLYCOL 3350 17 GRAM ORAL POWDER PACKET: g | 0 refills | 0 days

## 2017-11-28 MED FILL — GAVILAX POWDER/3350/POWD: GAVILAX POWDER/3350/POWD | 15 days supply | Qty: 1 | Fill #0

## 2017-11-28 MED FILL — SENNA TABLET/8.6MG/TAB: SENNA TABLET/8.6MG/TAB | 30 days supply | Qty: 60 | Fill #0

## 2017-11-28 MED FILL — ONDANSETRON/4MG/TAB: ONDANSETRON/4MG/TAB | 10 days supply | Qty: 30 | Fill #0

## 2017-11-28 NOTE — Unmapped (Signed)
Thanks for your excellent care of Brenda. Mccarty. I have talked with her this AM about the findings on MRCP and the lab abnormalities. I have told her that I am concerned that this cancer is very aggressive and seems to be progressing rapidly. She is aware how serious this is as her sister died of metastatic breast ca with liver involvement. Brenda Stobaugh's cancer is chemo-naive. I think it makes sense to give a trial of chemotherapy to see if we can regain control. This is her wish also. Given the visceral crisis, I would like to use a double. Gem/paclitaxel. Given the hepatic dysfunction we will use 50% DR paclitaxel and 20% DR gem. I have talked to her about potential toxicity esp myelosuppression. I have talked to her about risk of increased toxicity given the hepatic dysfunction. I have also suggested blood transfusion. If first chemo is today, then I will see her Friday for day 8 and lab evaluation.   One reference for the regimen we are using is Del Leisa Lenz [facility] Cancer 2013 for example.   ECD  E. Marc Morgans, MD  Professor of Medicine  Breast Oncology and Developmental Therapeutics  [facility]   [facility]

## 2017-11-28 NOTE — Unmapped (Signed)
Medicine Daily Progress Note    Assessment/Plan:  Principal Problem:    Hyperbilirubinemia  Active Problems:    Metastatic breast cancer (CMS-HCC)    Abdominal pain    Left hip pain  Resolved Problems:    * No resolved hospital problems. *           ??  Brenda Mccarty is a 62 y.o. 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   ??  Abdominal pain with elevated liver enzymes/cholestasis due compression from increased liver metastases: Admitted with increasing LFTs.  MRCP performed on 3/8 with increased hepatic metastasis involving all lobes with mild distal intrahepatic biliary ductal dilation due to large central hepatic tumor burden. Biliary GI has seen her and there is no utility in stenting as the small ducts that may be amenable to stenting would not result in clinically significant biliary drainage.   - Per Dr. Avis Epley, plan for chemotherapy (likely dose reduced taxol as it is metabolized by the liver) to start on 3/10   - follow-up acute hepatitis panel  -Check INR to better characterize liver synthetic function, consider Vit K challenge if INR >1.5     Metastatic ER + PR- HER 2 - breast cancer to bone, liver, lungs: First noticed symptoms in September 2018 with bilateral breast swelling, then sent right breast swelling with skin dimpling. Found to have ER + PR - HER-2 negative breast cancer and right breast biopsy.  Bone scan in November 2018 with 90 months to the right ilium, ribs, humerus, lumbosacral spine.  CT scan from November showed liver metastases and pulmonary metastases.  She was started on monthly Zometa in November 2018. Underwent liver biopsy in November which confirmed liver metastases.  Started letrozole and Ibrance in November 2018 and stopped last week after CT abdomen and pelvis showed progression of hepatic and osseous lesions. She arrived to infusion clinic on 11/26/17 to start day 1 cycle 1 of paclitaxel, but pre chemo labs were notable for elevated LFTs and elevated bilirubin. Given the patient also had abdominal pain she was admitted for workup. Now with worsening liver metastasis and t.bili continues to rise.  - Per Dr. Avis Epley, plan for chemotherapy (likely dose reduced taxol as it is metabolized by the liver) to start on 3/10   ??  AKI (Resolved): Baseline creatinine is .7-.8. Cr of 1.23 on arrival. Likely pre-renal due to poor intake. Resolved with fluids.   ??  Lower extremity edema w/exertional dyspnea: Patient with lower extremity edema on arrival and reports shortness of breath with exertion. ECHO from 06/2017 shows EF 60-65%, mild tricuspid regurgitation, and normal right ventricular systolic function. Lower extremity edema is due to venous congestion in the setting of worsening liver mets. SOB may be due to decreased diaphragmatic excursion due to hepatomegaly. Pro BNP is not elevated (108) and CXR is clear so less likely cardiac source.  - treatment of malignancy as above  - gentle with fluids  ??  Left hip pain: Patient with several weeks of left hip pain. This may be due to bony metastasis, but she says she was diagnosed with bursitis by a physician and [facility] recently.   - tylenol prn  - Voltaren gel   ??  Macrocytic anemia: Baseline Hgb  9-10. Hgb of 8.7 on admission. Patient states she has had 1 episode of wiping with blood on the toilet paper which seems consistent with irritation due to recent constipation. B12, folate, TSH are normal. Macrocytic anemia may be due to liver involvement of malignancy.   - Daily CBC  ??  Hx of HTN: Takes lisinopril 5 mg daily at home. BP remains well controlled off treatment.     ___________________________________________________________________    Subjective:  No acute events overnight. The patient states that she has a little abdominal discomfort but denies significant abdominal pain. She also notes her back is intromittently a little sore but improved with Voltaren gel. She did have BM yesterday. She is hoping to know more about the chemo plan today.      Labs/Studies:  Labs and Studies from the last 24hrs per EMR and Reviewed    Pressure Ulcer(s)    Active Pressure Ulcer     None              Vitals:    11/28/17 0737   BP: 118/70   Pulse: 64   Resp: 16   Temp: 35.9 ??C   SpO2: 98%       Objective:  General: Pleasant female in NAD in bed  HEENT: + Scleral icterus  CV: Left chest port in place without erythema or signs of infection, regular rate and rhythm, no murmurs  Pulmonary: Clear to auscultation bilaterally, no wheezing, no crackles  Abdomen: Abdomen is mildly tender to palpation over the right upper quadrant, no rebound or guarding, marked hepatomegaly is noted  Ext: trace pitting edema of the legs bilaterally   Skin: No rashes or lesions noted

## 2017-11-28 NOTE — Unmapped (Signed)
Problem: Patient Care Overview  Goal: Individualization and Mutuality  Outcome: Progressing      Problem: Fall Risk (Adult)  Goal: Identify Related Risk Factors and Signs and Symptoms  Related risk factors and signs and symptoms are identified upon initiation of Human Response Clinical Practice Guideline (CPG).    11/27/17 0419   Fall Risk (Adult)   Related Risk Factors (Fall Risk) environment unfamiliar;history of falls   Signs and Symptoms (Fall Risk) presence of risk factors     Goal: Absence of Fall  Patient will demonstrate the desired outcomes by discharge/transition of care.    11/27/17 1828   Fall Risk (Adult)   Absence of Fall making progress toward outcome       Comments: Pt afebrile, VSS, denies pain and or nausea.  Pt Rested well overnight, no issues. WCM.

## 2017-11-28 NOTE — Unmapped (Signed)
Problem: Patient Care Overview  Goal: Plan of Care Review  Outcome: Progressing  Pt is alert and oriented X4, afebrile vital signs stable. No complains of pain, nausea, vomit. Pt free of falls or injuries. Will continue to monitor.     Problem: Fall Risk (Adult)  Goal: Identify Related Risk Factors and Signs and Symptoms  Related risk factors and signs and symptoms are identified upon initiation of Human Response Clinical Practice Guideline (CPG).   Outcome: Progressing    Goal: Absence of Fall  Patient will demonstrate the desired outcomes by discharge/transition of care.   Outcome: Progressing

## 2017-11-28 NOTE — Unmapped (Signed)
Adult Nutrition Consult     Visit Type: RN Consult via Interdisciplinary Screening and Assessment Form. 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   Nutrition Hx: Patient reports not being able to eat more than a few bites over the last 4 weeks due to nausea and diarrhea.   Nutritionally Pertinent Meds: reviewed   Labs:   Results in Past 7 Days  Result Component Current Result   Sodium 138 (11/28/2017)   Potassium 4.0 (11/28/2017)   Chloride 104 (11/28/2017)   CO2 29.0 (11/28/2017)   BUN 11 (11/28/2017)   Creatinine 0.78 (11/28/2017)   Glucose 91 (11/28/2017)   Calcium 8.9 (11/28/2017)   Magnesium 1.8 (11/28/2017)   Phosphorus 3.1 (11/28/2017)        Abd/GI: see above    Skin:   Patient Lines/Drains/Airways Status    Active Wounds     None               Current nutrition therapy order:   Nutrition Orders          Nutrition Therapy General (Regular) starting at 03/09 1252           Anthropometric Data:  -- Height: 157.5 cm (5' 2.01)   -- Last recorded weight: 60.6 kg (133 lb 8 oz)  -- Admission weight: 60.5  -- IBW: 49.99 kg  -- Percent IBW: 121%  -- BMI: Body mass index is 24.41 kg/m??.   -- Weight changes this admission:   Last 5 Recorded Weights    11/26/17 2100 11/27/17 0952   Weight: 60.4 kg (133 lb 3.2 oz) 60.6 kg (133 lb 8 oz)      -- Weight history PTA: Patient confirms wt loss from 67 kg down to 61 kg over the last 3 months.   Wt Readings from Last 10 Encounters:   11/27/17 60.6 kg (133 lb 8 oz)   11/19/17 61.7 kg (135 lb 14.6 oz)   11/19/17 61.6 kg (135 lb 11.2 oz)   10/22/17 64.3 kg (141 lb 10.3 oz)   10/22/17 65.1 kg (143 lb 9.6 oz)   09/24/17 65.6 kg (144 lb 11.2 oz)   09/02/17 67 kg (147 lb 12.8 oz)   08/20/17 66.9 kg (147 lb 7.8 oz)   08/20/17 67.3 kg (148 lb 4.8 oz)   08/04/17 66.8 kg (147 lb 4.8 oz)        Daily Estimated Nutrient Needs:   Energy: 1513-1815 kcals [25-30 kcal/kg using  , 60.5 kg (11/28/17 1446)]  Protein: 73-91 gm [1.2-1.5 gm/kg using admission body weight, 60.5 kg (11/28/17 1446)]  Carbohydrate:   [no restriction]  Fluid: 1610-9604 [1 mL/kcal (maintenance)]     Nutrition Focused Physical Exam:  Fat Areas Examined  Orbital: Moderate loss  Upper Arm: Moderate loss  Thoracic: Mild loss      Muscle Areas Examined  Temple: Mild loss  Clavicle: No loss  Acromion: No loss  Scapular: No loss  Dorsal Hand: No loss  Patellar: No loss  Posterior Calf: No loss         Nutrition Evaluation  Overall Impressions: Moderate fat loss;No muscle loss (11/28/17 1450)  Nutrition Designation: Normal weight (BMI 18.50 - 24.99 kg/m2) (11/28/17 1450)     DIAGNOSIS:  Malnutrition Assessment using AND/ASPEN Clinical Characteristics:    Non-severe (Moderate) Protein-Calorie Malnutrition in the context of chronic illness (11/28/17 1451)        Energy Intake: < 75% of estimated energy requirement for > or equal to 1 month  Interpretation of Wt. Loss: > or equal to 7.5% x 3 month  Fat Loss: Moderate              Overall nutrition impression: Current diet is appropriate but not meeting nutrient needs.      GOALS:  Oral Intake:       - Patient to consume 25-50% of 3 meals per day.     RECOMMENDATIONS AND INTERVENTIONS:  Recommend continue current nutrition therapy   Gave tips on foods that may help with diarrhea and nausea.   Recommended patient order shakes for additional kcals.   Encouraged patient to utilize menus to order foods she likes and can tolerate to promote good po intake.      RD Follow Up Parameters:  1-2 times per week (and more frequent as indicated)     Danella Deis, MS, RD, LDN, CDE, CNSC  Pager [phone]

## 2017-11-28 NOTE — Unmapped (Signed)
Physician Discharge Summary [facility]  4 ONC [facility]  [address]  [place] [address]  Dept: [phone]  Loc: [phone]     Identifying Information:   Brenda Mccarty  01/04/1956  [id number]    Primary Care Physician: Harrold Donath, MD   Code Status:   Orders Placed This Encounter   Procedures   ??? Full Code     Standing Status:   Standing     Number of Occurrences:   1       Admit Date: 11/26/2017    Discharge Date: 11/28/2017     Discharge To: Home    Discharge Service: Oncology/Hematology (MDE)    Discharge Attending Physician: Marco Collie, MD    Discharge Diagnoses:  Principal Problem:    Hyperbilirubinemia  Active Problems:    Metastatic breast cancer (CMS-HCC)    Abdominal pain    Left hip pain  Resolved Problems:    * No resolved hospital problems. *      Outpatient Provider Follow Up Issues:   Please recheck CBC and CMP (labs requested with follow-up)    Hospital Course:   Brenda Mccarty??is a 62 y.o.??female??with a past medical history of hypertension and ER +??PR -??HER-2 negative??breast cancer with metastasis to liver, lung, bone who??was admitted from infusion clinic due to 3 weeks of nausea and abdominal pain with elevated LFTs and t.bili.   ??  Abdominal pain with elevated liver enzymes, t.bili 2/2 to compression from increased liver metastases: Admitted from infusion clinic when pre-chemotherapy labs were significant for AST of 305,??ALT of 105, alk phos of??486, t.bili of 3.7, abd direct bili of 2.8. Bilirubin normal at the beginning of February. Total Bilirubin continued to rise during admission**. MRCP performed on 3/8 with increased hepatic metastasis involving all lobes with mild distal intrahepatic biliary ductal dilation due to large central hepatic tumor burden. Biliary GI was consulted and said no utility in stenting as the small ducts that may be amenable to stenting would not result in clinically significant biliary drainage. Given worsening liver metastases she was started on chemotherapy while inpatient as below.   ??  Metastatic ER + PR- HER 2 - breast cancer to bone, liver, lungs: First noticed symptoms in September 2018 with bilateral breast swelling, then sent right breast swelling with skin dimpling. Found to have ER +??PR -??HER-2 negative??breast cancer and right breast biopsy. Bone scan in November 2018 with 90 months to the right ilium, ribs, humerus, lumbosacral spine. ??CT scan from November showed liver metastases and pulmonary metastases. ??She was started on monthly Zometa in November 2018. Underwent liver biopsy in November which confirmed liver metastases. Started letrozole and Ibrance in November 2018 and stopped the week of 11/15/17 after CT abdomen and pelvis showed progression of hepatic and osseous lesions. She arrived to infusion clinic on 11/26/17 to start day 1 cycle 1 of paclitaxel, but pre chemo??labs were notable for elevated LFTs and elevated bilirubin. MRCP on 11/26/17 with worsening hepatic metastases. She was started on chemotherapy inpatient  with dose reduced Taxol (due to hepatic function and gemcitabine (C1D1 11/28/2017) which will be planned for 12 cycles. She was discharged after chemotherapy completed. Outpatient follow-up with labs requested with Dr. Avis Epley.  A bowel regimen was started on discharge since patient will be receiving Zofran.     AKI: Baseline creatinine is .7-.8. Cr of 1.23 on arrival. Likely pre-renal as patient had very poor oral intake over recent weeks due to worsening abdominal pain and nausea.  Cr down trended to baseline after fluids.  ??  Lower extremity edema w/exertional dyspnea: Patient with lower extremity edema on arrival and reported shortness of breath with exertion. She dened orthopnea and paroxysmal nocturnal dyspnea. She had no wheezing or crackles on exam. Her ECHO from 06/2017 showed EF 60-65%, mild tricuspid regurgitation, and normal right ventricular systolic function. Her pro BNP was not elevated on admission and CXR without pulmonary edema or pleural effusions. Suspect her lower extremity edema is due to venous congestion in the setting of worsening liver mets. Albumin has also trended down over the past 2 months (4.7 in January to 3.3 today) in the setting of poor appetite.   ??  Left hip pain: Patient described several weeks of left hip pain. This is likely due to bony metastasis, but she says she was diagnosed with bursitis by a physician and Deretha Emory. She has been using heat and naproxen as needed for pain. She was given Voltaren gel and tylenol prn during admission and this helped her pain.   ??  Macrocytic anemia: Baseline Hgb 9-10. Hgb of 8.7 on admission. Patient stated she has had 1 episode of wiping with blood on the toilet paper which seems consistent with irritation due to recent constipation. Has recent macrocytosis and maybe due to hepatic mets. B12, TSH, and folate were normal. CBC was trended while admitted and the patient received 1u PRBC due to Hb of 7.3. She will have labs monitored at follow-up.   ??  Hx of HTN: Takes lisinopril 5 mg daily at home. BP on arrival was 120/68. Held home lisinopril in the setting of AKI. This was resumed on discharge.     Procedures:  No admission procedures for hospital encounter.  ______________________________________________________________________  Discharge Medications:     Your Medication List      START taking these medications    polyethylene glycol 17 gram packet  Commonly known as:  MIRALAX  Take 17 g by mouth daily as needed.     senna 8.6 mg tablet  Commonly known as:  SENOKOT  Take 2 tablets by mouth nightly.        CONTINUE taking these medications    acetaminophen 500 MG tablet  Commonly known as:  TYLENOL  Take 500 mg by mouth.     CALCIUM 500 500 mg calcium (1,250 mg) chewable tablet  Generic drug:  calcium carbonate  Chew 1 tablet daily.     ibuprofen 200 MG tablet  Commonly known as:  ADVIL,MOTRIN  Take 200 mg by mouth.     lidocaine-prilocaine cream Commonly known as:  EMLA  Apply to affected area once     lisinopril 5 MG tablet  Commonly known as:  PRINIVIL,ZESTRIL  Take 1 tablet (5 mg total) by mouth daily.     MULTI-VITAMIN WITH IRON ORAL  Take 1 tablet by mouth daily.     ondansetron 4 MG tablet  Commonly known as:  ZOFRAN  Take 1 tablet (4 mg total) by mouth every eight (8) hours as needed for nausea.            Allergies:  Tramadol  ______________________________________________________________________  Pending Test Results (if blank, then none):   Order Current Status    Hepatitis Panel, Acute In process          Most Recent Labs:  All lab results last 24 hours -   Recent Results (from the past 24 hour(s))   Magnesium Level    Collection Time: 11/28/17  6:10 AM   Result Value Ref Range    Magnesium 1.8 1.6 - 2.2 mg/dL   Phosphorus Level    Collection Time: 11/28/17  6:10 AM   Result Value Ref Range    Phosphorus 3.1 2.9 - 4.7 mg/dL   Comprehensive metabolic panel    Collection Time: 11/28/17  6:10 AM   Result Value Ref Range    Sodium 138 135 - 145 mmol/L    Potassium 4.0 3.5 - 5.0 mmol/L    Chloride 104 98 - 107 mmol/L    CO2 29.0 22.0 - 30.0 mmol/L    BUN 11 7 - 21 mg/dL    Creatinine 5.40 9.81 - 1.00 mg/dL    BUN/Creatinine Ratio 14     EGFR MDRD Non Af Amer >=60 >=60 mL/min/1.17m2    EGFR MDRD Af Amer >=60 >=60 mL/min/1.43m2    Anion Gap 5 (L) 9 - 15 mmol/L    Glucose 91 65 - 179 mg/dL    Calcium 8.9 8.5 - 19.1 mg/dL    Albumin 3.0 (L) 3.5 - 5.0 g/dL    Total Protein 6.4 (L) 6.5 - 8.3 g/dL    Total Bilirubin 3.9 (H) 0.0 - 1.2 mg/dL    AST 478 (H) 14 - 38 U/L    ALT 114 (H) 15 - 48 U/L    Alkaline Phosphatase 408 (H) 38 - 126 U/L   CBC w/ Differential    Collection Time: 11/28/17  6:10 AM   Result Value Ref Range    WBC 5.2 4.5 - 11.0 10*9/L    RBC 2.00 (L) 4.00 - 5.20 10*12/L    HGB 7.3 (L) 12.0 - 16.0 g/dL    HCT 29.5 (L) 62.1 - 46.0 %    MCV 118.2 (H) 80.0 - 100.0 fL    MCH 36.4 (H) 26.0 - 34.0 pg    MCHC 30.8 (L) 31.0 - 37.0 g/dL    RDW 30.8 (H) 65.7 - 15.0 %    MPV 8.6 7.0 - 10.0 fL    Platelet 258 150 - 440 10*9/L    Neutrophils % 55.7 %    Lymphocytes % 27.0 %    Monocytes % 10.7 %    Eosinophils % 0.7 %    Basophils % 0.8 %    Absolute Neutrophils 2.9 2.0 - 7.5 10*9/L    Absolute Lymphocytes 1.4 (L) 1.5 - 5.0 10*9/L    Absolute Monocytes 0.6 0.2 - 0.8 10*9/L    Absolute Eosinophils 0.0 0.0 - 0.4 10*9/L    Absolute Basophils 0.0 0.0 - 0.1 10*9/L    Large Unstained Cells 5 (H) 0 - 4 %    Macrocytosis Marked (A) Not Present    Anisocytosis Moderate (A) Not Present    Hypochromasia Marked (A) Not Present   Type and Screen    Collection Time: 11/28/17 12:30 PM   Result Value Ref Range    ABO Grouping O POS    Prepare RBC    Collection Time: 11/28/17  1:39 PM   Result Value Ref Range    Crossmatch Compatible     Unit Blood Type O Pos     ISBT Number 5100     Unit # [id number]     Status Ready     Spec Expiration [id number]     Product ID Red Blood Cells     PRODUCT CODE [id number]        Relevant Studies/Radiology (if blank, then none):  Xr Chest 2 Views  Result Date: 11/26/2017  EXAM: XR CHEST 2 VIEWS DATE: 11/26/2017 7:11 PM ACCESSION: [id number] UN DICTATED: 11/26/2017 9:44 PM INTERPRETATION LOCATION: [facility] CLINICAL INDICATION: 62 years old Female with SHORTNESS OF BREATH-  COMPARISON: None TECHNIQUE: PA and Lateral Chest Radiographs. FINDINGS: Radiographically clear lungs. Accessed Port-A-Cath with the tip in the right atrium. Status post right mastectomy. No pleural effusion or pneumothorax. Unremarkable cardiomediastinal silhouette.     Clear lungs.     Mri Abdomen W Wo Contrast Mrcp    Result Date: 11/27/2017  EXAM: MRI abdomen with and without contrast DATE: 11/26/2017 6:47 PM ACCESSION: [id number] UN DICTATED: 11/26/2017 7:10 PM INTERPRETATION LOCATION: [facility] CLINICAL INDICATION: 62 years old Female with SECONDARY MALIGNANT NEOPLASM OF LIVER-  COMPARISON: Bone scan and CT chest, abdomen and pelvis 11/12/2017, and other prior studies. TECHNIQUE: MRI of the abdomen was obtained with and without IV contrast. Multisequence, multiplanar images were obtained.    FINDINGS: LOWER CHEST: Unremarkable. ABDOMEN: HEPATOBILIARY: Innumerable (more than 20) metastases throughout the liver are mildly increased in size and number, with largest conglomerate metastatic lesion centered in segments 4A/8 measuring 10.5 x 8.8 x 10.8 cm, mildly increased from prior (17:30, 20:48; previously 9.3 x 8.3 cm). There is a 0.8 cm filling defect in the upper common hepatic duct (13:32); this is favored to represent a crossing vessel upon correlation with additional T2-weighted images (3:16). Mild upstream intrahepatic biliary ductal dilation is predominantly peripheral, and likely attributable to the large central hepatic tumor burden. Gallbladder nondilated, with gallbladder wall thickening measuring up to 0.9 cm (17:50). No pericholecystic fluid. PANCREAS: Unremarkable. SPLEEN: Unremarkable. ADRENAL GLANDS: Unremarkable. KIDNEYS/URETERS: The kidneys enhance symmetrically. No enhancing renal mass. No hydronephrosis or urolithiasis. Bilateral renal cysts correlate with prior imaging. BOWEL/PERITONEUM/RETROPERITONEUM: No bowel obstruction. No acute inflammatory process. No ascites. VASCULATURE: Abdominal aorta normal in caliber and patent. There is compressive effect upon the central portal vein branches, but the portal vein and its branches are patent. SMV and splenic vein patent. Compressive effect upon the hepatic veins, which are patent. Narrowed intrahepatic inferior vena cava; IVC otherwise unremarkable. LYMPH NODES: No adenopathy. BONES/SOFT TISSUES: Enhancing lesion in the proximal right humerus is poorly evaluated due to the field-of-view, but the cortex is not seen medially, suggesting the possibility of a pathologic fracture (20:33). Similar finding also present in the left proximal humerus (20:27). Re-demonstration of compression deformity of L4. No new compression deformity identified.     -- Extensive hepatic metastatic disease involving all lobes, mildly increased from prior. -- 0.8 cm filling defect within the upper common hepatic duct is indeterminate, but favored to represent a crossing vessel. Mild distal intrahepatic biliary ductal dilation likely due to large central hepatic tumor burden. -- Mild interval increase in extensive osseous metastatic disease involving nearly all levels of the spine, the entire right sacral ala and much of the adjacent right iliac bone, correlating with recent bone scan. Additionally, there is a large enhancing lesion in the proximal right humerus, with suggestion of cortical breakthrough, which could represent pathologic fracture. A similar lesion is also present on the left. Correlation with physical exam and dedicated humerus radiographs recommended.    ______________________________________________________________________  Discharge Instructions:               Follow Up instructions and Outpatient Referrals     Bilirubin, Direct       CBC and differential       Hepatic Function Panel             Appointments which  have been scheduled for you    Dec 03, 2017  9:30 AM EDT  (Arrive by 9:00 AM)  NURSE LAB DRAW with ADULT ONC LAB  [facility] ADULT ONCOLOGY LAB DRAW STATION [place] [facility] REGION) [address]  [place] [address]  [phone]   Dec 03, 2017 10:30 AM EDT  (Arrive by 10:00 AM)  LEVEL 120 with [employer] CHAIR 12  [place] ONCOLOGY INFUSION [place] [facility] REGION) [address]  [place] [address]  [phone]   Dec 10, 2017  8:00 AM EDT  (Arrive by 7:30 AM)  NURSE LAB DRAW with ADULT ONC LAB  [facility] ADULT ONCOLOGY LAB DRAW STATION [facility] REGION) [address]  [place] [address]  [phone]   Dec 10, 2017  9:00 AM EDT  (Arrive by 8:30 AM)  RETURN ACTIVE [place] with Marcy Panning, FNP  [facility] HEMATOLOGY ONCOLOGY 2ND FLR CANCER HOSP [facility] REGION) [address]  [place] [address]  [phone]   Dec 10, 2017 10:30 AM EDT  (Arrive by 10:00 AM)  LEVEL 120 with [employer] CHAIR 26  [place] ONCOLOGY INFUSION [place] [facility] REGION) [address]  [place] [address]  [phone]   Dec 24, 2017  8:30 AM EDT  (Arrive by 8:00 AM)  NURSE LAB DRAW with ADULT ONC LAB  [facility] ADULT ONCOLOGY LAB DRAW STATION [place] [facility] REGION) [address]  [place] [address]  [phone]   Dec 24, 2017  9:30 AM EDT  (Arrive by 9:00 AM)  LEVEL 090 with ONCINF CHAIR 19  [place] ONCOLOGY INFUSION [place] [facility] REGION) [address]  [place] [address]  [phone]   Sep 05, 2018  8:00 AM EST  (Arrive by 7:45 AM)  Physical with Deetta Perla, MD  [place] [facility] INTERNAL MEDICINE WEAVER CROSSING [place] [facility] REGION) [address]  [place] [address]  [phone]   Arrive 15 minutes prior to appointment.        ______________________________________________________________________  Discharge Day Services:  BP 130/85  - Pulse 74  - Temp 37 ??C (Oral)  - Resp 16  - Ht 162.2 cm (5' 3.86) Comment: with shoes on - Wt 61.7 kg (136 lb) Comment: shoes on - SpO2 98%  - BMI 23.45 kg/m??   Pt seen on the day of discharge and determined appropriate for discharge.    Condition at Discharge: good    Length of Discharge: I spent greater than 30 mins in the discharge of this patient.

## 2017-11-28 NOTE — Unmapped (Signed)
Brenda Mccarty??is a 62 y.o.??female??with a past medical history of hypertension and ER +??PR -??HER-2 negative??breast cancer with metastasis to liver, lung, bone who??was admitted from infusion clinic due to 3 weeks of nausea and abdominal pain with elevated LFTs and t.bili.   ??  Abdominal pain with elevated liver enzymes, t.bili 2/2 to compression from increased liver metastases: Admitted from infusion clinic when pre-chemotherapy labs were significant for AST of 305,??ALT of 105, alk phos of??486, t.bili of 3.7, abd direct bili of 2.8. Bilirubin normal at the beginning of February. Total Bilirubin continued to rise during admission**. MRCP performed on 3/8 with increased hepatic metastasis involving all lobes with mild distal intrahepatic biliary ductal dilation due to large central hepatic tumor burden. Biliary GI was consulted and said no utility in stenting as the small ducts that may be amenable to stenting would not result in clinically significant biliary drainage. Given worsening liver metastases she was started on chemotherapy while inpatient with ***  ??  Metastatic ER + PR- HER 2 - breast cancer to bone, liver, lungs: First noticed symptoms in September 2018 with bilateral breast swelling, then sent right breast swelling with skin dimpling. Found to have ER +??PR -??HER-2 negative??breast cancer and right breast biopsy. Bone scan in November 2018 with 90 months to the right ilium, ribs, humerus, lumbosacral spine. ??CT scan from November showed liver metastases and pulmonary metastases. ??She was started on monthly Zometa in November 2018. Underwent liver biopsy in November which confirmed liver metastases. Started letrozole and Ibrance in November 2018 and stopped the week of 11/15/17 after CT abdomen and pelvis showed progression of hepatic and osseous lesions. She arrived to infusion clinic on 11/26/17 to start day 1 cycle 1 of paclitaxel, but pre chemo??labs were notable for elevated LFTs and elevated bilirubin. MRCP on 11/26/17 with worsening hepatic metastases. She was started on chemotherapy inpatient with ***    AKI: Baseline creatinine is .7-.8. Cr of 1.23 on arrival. Likely pre-renal as patient had very poor oral intake over recent weeks due to worsening abdominal pain and nausea. Cr down trended after fluids.  ??  Lower extremity edema w/exertional dyspnea: Patient with lower extremity edema on arrival and reported shortness of breath with exertion. She dened orthopnea and paroxysmal nocturnal dyspnea. She had no wheezing or crackles on exam. Her ECHO from 06/2017 showed EF 60-65%, mild tricuspid regurgitation, and normal right ventricular systolic function. Her pro BNP was not elevated on admission and CXR without pulmonary edema or pleural effusions. Suspect her lower extremity edema is due to venous congestion in the setting of worsening liver mets. Albumin has also trended down over the past 2 months (4.7 in January to 3.3 today) in the setting of poor appetite.   ??  Left hip pain: Patient described several weeks of left hip pain. This is likely due to bony metastasis, but she says she was diagnosed with bursitis by a physician and Deretha Emory. She has been using heat and naproxen as needed for pain. She was given Voltaren gel and tylenol prn during admission and this helped her pain.   ??  Macrocytic anemia: Baseline Hgb 9-10. Hgb of 8.7 on admission. Patient stated she has had 1 episode of wiping with blood on the toilet paper which seems consistent with irritation due to recent constipation. Has recent macrocytosis and may coincide with nutritional deficiency in the setting of down trending albumin and poor appetite. B12 and folate wer normal. CBC was trended while admitted and **  ??  Hx of HTN: Takes lisinopril 5 mg daily at home. BP on arrival was 120/68. Held home lisinopril in the setting of AKI. This was *** on discharge.

## 2017-11-29 LAB — HEPATITIS A IGM ANTIBODY: Hepatitis A virus Ab.IgM:PrThr:Pt:Ser:Ord:: NONREACTIVE

## 2017-11-29 LAB — HEPATITIS PANEL, ACUTE
HEPATITIS B SURFACE ANTIGEN: NONREACTIVE
HEPATITIS C ANTIBODY: NONREACTIVE

## 2017-11-29 NOTE — Unmapped (Signed)
Problem: Patient Care Overview  Goal: Plan of Care Review  Outcome: Progressing  VSS, afebrile, no acute event thus far into the AM shift. Cycle 1 of taxol/gemzar initiated today. No acute event this far, gemzar infusing. Pt opted to wait for chemo to be completed before blood initiation. She will require one unit of pRBC prior to late discharge today. She will have her significant other coming to pick her up. She received MiraLAX for constipation earlier in the AM shift and have had multiple BM since this. First BM was hard requiring straining with some blood noted by patient upon wiping. She denies anymore incidence of blood in the stool. WCTM and provide intervention as appropriate.     Pt refused Influenza vaccination.     Problem: Fall Risk (Adult)  Goal: Absence of Fall  Patient will demonstrate the desired outcomes by discharge/transition of care.   Outcome: Progressing      Problem: VTE, DVT and PE (Adult)  Goal: Signs and Symptoms of Listed Potential Problems Will be Absent, Minimized or Managed (VTE, DVT and PE)  Signs and symptoms of listed potential problems will be absent, minimized or managed by discharge/transition of care (reference VTE, DVT and PE (Adult) CPG).   Outcome: Progressing      Problem: Chemotherapy Effects (Adult)  Goal: Signs and Symptoms of Listed Potential Problems Will be Absent, Minimized or Managed (Chemotherapy Effects)  Signs and symptoms of listed potential problems will be absent, minimized or managed by discharge/transition of care (reference Chemotherapy Effects (Adult) CPG).  Outcome: Progressing

## 2017-11-30 NOTE — Unmapped (Signed)
[facility] Refill Coordination Note  Medication: Ibrance 125mg     Unable to reach patient to schedule shipment for medication being filled at [facility]. Left voicemail on phone.  As this is the 3rd unsuccessful attempt to reach the patient, no additional phone call attempts will be made at this time.      Phone numbers attempted: [phone]  Last scheduled delivery: 10/21/17    Please call the [facility] at [phone] (option [phone]) should you have any further questions.      Thanks,  [facility] Specialty Team

## 2017-12-03 ENCOUNTER — Encounter: Admit: 2017-12-03 | Discharge: 2017-12-04 | Payer: PRIVATE HEALTH INSURANCE

## 2017-12-03 DIAGNOSIS — C50919 Malignant neoplasm of unspecified site of unspecified female breast: Secondary | ICD-10-CM

## 2017-12-03 DIAGNOSIS — C50811 Malignant neoplasm of overlapping sites of right female breast: Principal | ICD-10-CM

## 2017-12-03 DIAGNOSIS — Z170 Estrogen receptor positive status [ER+]: Secondary | ICD-10-CM

## 2017-12-07 ENCOUNTER — Encounter: Admit: 2017-12-07 | Discharge: 2017-12-08 | Payer: PRIVATE HEALTH INSURANCE | Attending: Family | Primary: Family

## 2017-12-07 ENCOUNTER — Encounter
Admit: 2017-12-07 | Discharge: 2017-12-08 | Payer: PRIVATE HEALTH INSURANCE | Attending: Student in an Organized Health Care Education/Training Program | Primary: Student in an Organized Health Care Education/Training Program

## 2017-12-07 ENCOUNTER — Encounter: Admit: 2017-12-07 | Discharge: 2017-12-08 | Payer: PRIVATE HEALTH INSURANCE

## 2017-12-07 DIAGNOSIS — C50919 Malignant neoplasm of unspecified site of unspecified female breast: Principal | ICD-10-CM

## 2017-12-07 DIAGNOSIS — C50811 Malignant neoplasm of overlapping sites of right female breast: Principal | ICD-10-CM

## 2017-12-07 DIAGNOSIS — Z170 Estrogen receptor positive status [ER+]: Secondary | ICD-10-CM

## 2017-12-07 MED ORDER — RANITIDINE 150 MG TABLET
ORAL_TABLET | Freq: Two times a day (BID) | ORAL | 1 refills | 0.00000 days | Status: CP
Start: 2017-12-07 — End: 2018-12-07

## 2017-12-07 MED ORDER — NYSTATIN 100,000 UNIT/ML ORAL SUSPENSION: 500000 [IU] | mL | Freq: Four times a day (QID) | 2 refills | 0 days | Status: AC

## 2017-12-07 MED ORDER — RANITIDINE 150 MG TABLET: 150 mg | tablet | 1 refills | 0 days

## 2017-12-07 MED ORDER — DEXAMETHASONE 4 MG TABLET: 4 mg | tablet | Freq: Four times a day (QID) | 0 refills | 0 days | Status: SS

## 2017-12-07 MED ORDER — PROCHLORPERAZINE MALEATE 10 MG TABLET: 10 mg | tablet | 1 refills | 0 days

## 2017-12-07 MED ORDER — NYSTATIN 100,000 UNIT/ML ORAL SUSPENSION
Freq: Four times a day (QID) | ORAL | 2 refills | 0.00000 days | Status: CP
Start: 2017-12-07 — End: 2017-12-07

## 2017-12-07 MED ORDER — DEXAMETHASONE 4 MG TABLET
ORAL_TABLET | Freq: Four times a day (QID) | ORAL | 0 refills | 0.00000 days | Status: SS
Start: 2017-12-07 — End: 2017-12-07

## 2017-12-07 MED ORDER — PROCHLORPERAZINE MALEATE 10 MG TABLET
ORAL_TABLET | Freq: Four times a day (QID) | ORAL | 1 refills | 0.00000 days | Status: CP | PRN
Start: 2017-12-07 — End: 2017-12-07

## 2017-12-07 MED FILL — NYSTATIN/100000U/SUS: NYSTATIN/100000U/SUS | 3 days supply | Qty: 60 | Fill #0

## 2017-12-07 MED FILL — DEXAMETHASONE/4MG/TAB: DEXAMETHASONE/4MG/TAB | 30 days supply | Qty: 120 | Fill #0

## 2017-12-07 MED FILL — PROCHLORPERAZINE/10MG/TABS: PROCHLORPERAZINE/10MG/TABS | 7 days supply | Qty: 30 | Fill #0

## 2017-12-10 ENCOUNTER — Ambulatory Visit
Admit: 2017-12-10 | Discharge: 2017-12-12 | Disposition: A | Discharge status: Home / Self Care | Payer: PRIVATE HEALTH INSURANCE | Admitting: Hematology & Oncology

## 2017-12-10 DIAGNOSIS — K859 Acute pancreatitis without necrosis or infection, unspecified: Principal | ICD-10-CM

## 2017-12-11 DIAGNOSIS — K859 Acute pancreatitis without necrosis or infection, unspecified: Principal | ICD-10-CM

## 2017-12-12 MED ORDER — DEXAMETHASONE 4 MG TABLET
ORAL_TABLET | Freq: Four times a day (QID) | ORAL | 0 refills | 0.00000 days | Status: CP
Start: 2017-12-12 — End: 2017-12-12

## 2017-12-12 MED ORDER — DEXAMETHASONE 4 MG TABLET: 4 mg | tablet | Freq: Four times a day (QID) | 0 refills | 0 days | Status: AC

## 2017-12-12 MED ORDER — DEXAMETHASONE 4 MG TABLET: 4 mg | tablet | 0 refills | 0 days

## 2017-12-15 ENCOUNTER — Encounter: Admit: 2017-12-15 | Discharge: 2017-12-16 | Payer: PRIVATE HEALTH INSURANCE

## 2017-12-15 ENCOUNTER — Encounter
Admit: 2017-12-15 | Discharge: 2017-12-16 | Payer: PRIVATE HEALTH INSURANCE | Attending: Student in an Organized Health Care Education/Training Program | Primary: Student in an Organized Health Care Education/Training Program

## 2017-12-15 ENCOUNTER — Ambulatory Visit: Admit: 2017-12-15 | Discharge: 2017-12-16 | Payer: PRIVATE HEALTH INSURANCE | Attending: Family | Primary: Family

## 2017-12-15 DIAGNOSIS — Z170 Estrogen receptor positive status [ER+]: Secondary | ICD-10-CM

## 2017-12-15 DIAGNOSIS — C50919 Malignant neoplasm of unspecified site of unspecified female breast: Secondary | ICD-10-CM

## 2017-12-15 DIAGNOSIS — C50811 Malignant neoplasm of overlapping sites of right female breast: Principal | ICD-10-CM

## 2017-12-21 ENCOUNTER — Encounter: Admit: 2017-12-21 | Discharge: 2017-12-22 | Payer: PRIVATE HEALTH INSURANCE | Attending: Family | Primary: Family

## 2017-12-21 ENCOUNTER — Encounter
Admit: 2017-12-21 | Discharge: 2017-12-22 | Payer: PRIVATE HEALTH INSURANCE | Attending: Registered" | Primary: Registered"

## 2017-12-21 ENCOUNTER — Ambulatory Visit: Admit: 2017-12-21 | Discharge: 2017-12-22 | Payer: PRIVATE HEALTH INSURANCE

## 2017-12-21 ENCOUNTER — Encounter: Admit: 2017-12-21 | Discharge: 2017-12-22 | Payer: PRIVATE HEALTH INSURANCE

## 2017-12-21 DIAGNOSIS — C50919 Malignant neoplasm of unspecified site of unspecified female breast: Principal | ICD-10-CM

## 2017-12-23 MED FILL — NYSTATIN/100000U/SUS: NYSTATIN/100000U/SUS | 3 days supply | Qty: 60 | Fill #1

## 2018-02-08 ENCOUNTER — Ambulatory Visit
Admit: 2018-02-08 | Discharge: 2018-02-09 | Payer: PRIVATE HEALTH INSURANCE | Attending: Internal Medicine | Primary: Internal Medicine

## 2018-02-08 DIAGNOSIS — Z9189 Other specified personal risk factors, not elsewhere classified: Secondary | ICD-10-CM

## 2018-02-08 DIAGNOSIS — I10 Essential (primary) hypertension: Secondary | ICD-10-CM

## 2018-02-08 DIAGNOSIS — M7071 Other bursitis of hip, right hip: Secondary | ICD-10-CM

## 2018-02-08 DIAGNOSIS — C7951 Secondary malignant neoplasm of bone: Secondary | ICD-10-CM

## 2018-02-08 DIAGNOSIS — R600 Localized edema: Secondary | ICD-10-CM

## 2018-02-08 DIAGNOSIS — C50919 Malignant neoplasm of unspecified site of unspecified female breast: Principal | ICD-10-CM

## 2018-02-08 MED ORDER — FUROSEMIDE 20 MG TABLET
ORAL_TABLET | Freq: Every day | ORAL | 0 refills | 0 days | Status: CP
Start: 2018-02-08 — End: 2019-02-08

## 2018-02-08 MED ORDER — DICLOFENAC 1 % TOPICAL GEL
Freq: Four times a day (QID) | TOPICAL | 0 refills | 0 days | Status: CP
Start: 2018-02-08 — End: 2019-02-08

## 2018-06-21 DEATH — deceased
